# Patient Record
Sex: Female | Born: 1950 | Race: White | Hispanic: No | Smoking: Never smoker
Health system: Southern US, Community
[De-identification: ages and names within clinical notes are randomized; demographics above are authoritative.]

## PROBLEM LIST (undated history)

## (undated) DIAGNOSIS — F419 Anxiety disorder, unspecified: Secondary | ICD-10-CM

## (undated) DIAGNOSIS — N390 Urinary tract infection, site not specified: Secondary | ICD-10-CM

## (undated) DIAGNOSIS — G2581 Restless legs syndrome: Secondary | ICD-10-CM

## (undated) DIAGNOSIS — D510 Vitamin B12 deficiency anemia due to intrinsic factor deficiency: Secondary | ICD-10-CM

## (undated) DIAGNOSIS — F410 Panic disorder [episodic paroxysmal anxiety] without agoraphobia: Secondary | ICD-10-CM

## (undated) DIAGNOSIS — D473 Essential (hemorrhagic) thrombocythemia: Principal | ICD-10-CM

## (undated) DIAGNOSIS — E7800 Pure hypercholesterolemia, unspecified: Secondary | ICD-10-CM

## (undated) DIAGNOSIS — M549 Dorsalgia, unspecified: Secondary | ICD-10-CM

## (undated) HISTORY — PX: TUBAL LIGATION: SHX77

## (undated) HISTORY — DX: Vitamin B12 deficiency anemia due to intrinsic factor deficiency: D51.0

## (undated) HISTORY — PX: CHOLECYSTECTOMY: SHX55

## (undated) HISTORY — DX: Essential (hemorrhagic) thrombocythemia: D47.3

---

## 2001-12-15 ENCOUNTER — Ambulatory Visit (HOSPITAL_COMMUNITY): Admission: RE | Admit: 2001-12-15 | Discharge: 2001-12-15 | Payer: Self-pay | Admitting: Occupational Therapy

## 2001-12-15 ENCOUNTER — Encounter: Payer: Self-pay | Admitting: Occupational Therapy

## 2003-03-28 ENCOUNTER — Encounter: Payer: Self-pay | Admitting: Emergency Medicine

## 2003-03-28 ENCOUNTER — Inpatient Hospital Stay (HOSPITAL_COMMUNITY): Admission: EM | Admit: 2003-03-28 | Discharge: 2003-03-30 | Payer: Self-pay | Admitting: Emergency Medicine

## 2003-03-29 ENCOUNTER — Encounter: Payer: Self-pay | Admitting: Orthopaedic Surgery

## 2003-06-28 ENCOUNTER — Ambulatory Visit (HOSPITAL_COMMUNITY): Admission: RE | Admit: 2003-06-28 | Discharge: 2003-06-28 | Payer: Self-pay | Admitting: Occupational Therapy

## 2003-06-28 ENCOUNTER — Encounter: Payer: Self-pay | Admitting: Occupational Therapy

## 2004-07-25 ENCOUNTER — Ambulatory Visit (HOSPITAL_COMMUNITY): Admission: RE | Admit: 2004-07-25 | Discharge: 2004-07-25 | Payer: Self-pay | Admitting: Occupational Therapy

## 2004-09-02 ENCOUNTER — Ambulatory Visit (HOSPITAL_COMMUNITY): Admission: RE | Admit: 2004-09-02 | Discharge: 2004-09-02 | Payer: Self-pay | Admitting: General Surgery

## 2005-06-10 ENCOUNTER — Emergency Department (HOSPITAL_COMMUNITY): Admission: EM | Admit: 2005-06-10 | Discharge: 2005-06-11 | Payer: Self-pay | Admitting: *Deleted

## 2005-08-07 ENCOUNTER — Ambulatory Visit (HOSPITAL_COMMUNITY): Admission: RE | Admit: 2005-08-07 | Discharge: 2005-08-07 | Payer: Self-pay | Admitting: Occupational Therapy

## 2006-09-01 ENCOUNTER — Ambulatory Visit (HOSPITAL_COMMUNITY): Admission: RE | Admit: 2006-09-01 | Discharge: 2006-09-01 | Payer: Self-pay | Admitting: Nurse Practitioner

## 2007-09-05 ENCOUNTER — Ambulatory Visit (HOSPITAL_COMMUNITY): Admission: RE | Admit: 2007-09-05 | Discharge: 2007-09-05 | Payer: Self-pay | Admitting: Nurse Practitioner

## 2008-03-18 ENCOUNTER — Emergency Department (HOSPITAL_COMMUNITY): Admission: EM | Admit: 2008-03-18 | Discharge: 2008-03-18 | Payer: Self-pay | Admitting: Emergency Medicine

## 2008-09-11 ENCOUNTER — Ambulatory Visit (HOSPITAL_COMMUNITY): Admission: RE | Admit: 2008-09-11 | Discharge: 2008-09-11 | Payer: Self-pay | Admitting: Nurse Practitioner

## 2008-10-10 ENCOUNTER — Emergency Department (HOSPITAL_COMMUNITY): Admission: EM | Admit: 2008-10-10 | Discharge: 2008-10-10 | Payer: Self-pay | Admitting: Emergency Medicine

## 2009-02-15 ENCOUNTER — Ambulatory Visit: Payer: Self-pay | Admitting: Oncology

## 2009-03-19 LAB — COMPREHENSIVE METABOLIC PANEL
ALT: 23 U/L (ref 0–35)
AST: 20 U/L (ref 0–37)
Albumin: 4.2 g/dL (ref 3.5–5.2)
CO2: 28 mEq/L (ref 19–32)
Calcium: 9.4 mg/dL (ref 8.4–10.5)
Chloride: 107 mEq/L (ref 96–112)
Potassium: 3.8 mEq/L (ref 3.5–5.3)

## 2009-03-19 LAB — CBC WITH DIFFERENTIAL/PLATELET
BASO%: 0.3 % (ref 0.0–2.0)
Eosinophils Absolute: 0.1 10*3/uL (ref 0.0–0.5)
HCT: 39.6 % (ref 34.8–46.6)
MCHC: 35.8 g/dL (ref 31.5–36.0)
MONO#: 0.5 10*3/uL (ref 0.1–0.9)
NEUT#: 4.7 10*3/uL (ref 1.5–6.5)
NEUT%: 64 % (ref 38.4–76.8)
Platelets: 538 10*3/uL — ABNORMAL HIGH (ref 145–400)
WBC: 7.3 10*3/uL (ref 3.9–10.3)
lymph#: 2.1 10*3/uL (ref 0.9–3.3)

## 2009-03-19 LAB — CHCC SMEAR

## 2009-03-19 LAB — LACTATE DEHYDROGENASE: LDH: 125 U/L (ref 94–250)

## 2009-03-26 LAB — SEDIMENTATION RATE: Sed Rate: 2 mm/hr (ref 0–22)

## 2009-03-26 LAB — FERRITIN: Ferritin: 54 ng/mL (ref 10–291)

## 2009-04-24 ENCOUNTER — Ambulatory Visit: Payer: Self-pay | Admitting: Oncology

## 2009-04-26 LAB — CBC WITH DIFFERENTIAL/PLATELET
BASO%: 0.5 % (ref 0.0–2.0)
Eosinophils Absolute: 0.1 10*3/uL (ref 0.0–0.5)
LYMPH%: 37 % (ref 14.0–49.7)
MCHC: 34.3 g/dL (ref 31.5–36.0)
MONO#: 0.3 10*3/uL (ref 0.1–0.9)
NEUT#: 2.9 10*3/uL (ref 1.5–6.5)
Platelets: 500 10*3/uL — ABNORMAL HIGH (ref 145–400)
RBC: 4.26 10*6/uL (ref 3.70–5.45)
RDW: 12.4 % (ref 11.2–14.5)
WBC: 5.3 10*3/uL (ref 3.9–10.3)
lymph#: 2 10*3/uL (ref 0.9–3.3)

## 2009-06-05 ENCOUNTER — Ambulatory Visit: Payer: Self-pay | Admitting: Gastroenterology

## 2009-06-05 DIAGNOSIS — R1013 Epigastric pain: Secondary | ICD-10-CM

## 2009-06-05 DIAGNOSIS — K3189 Other diseases of stomach and duodenum: Secondary | ICD-10-CM | POA: Insufficient documentation

## 2009-06-10 ENCOUNTER — Ambulatory Visit: Payer: Self-pay | Admitting: Gastroenterology

## 2009-06-10 ENCOUNTER — Encounter: Payer: Self-pay | Admitting: Gastroenterology

## 2009-06-10 ENCOUNTER — Ambulatory Visit (HOSPITAL_COMMUNITY): Admission: RE | Admit: 2009-06-10 | Discharge: 2009-06-10 | Payer: Self-pay | Admitting: Gastroenterology

## 2009-06-11 ENCOUNTER — Encounter: Payer: Self-pay | Admitting: Gastroenterology

## 2009-07-24 ENCOUNTER — Ambulatory Visit (HOSPITAL_COMMUNITY): Admission: RE | Admit: 2009-07-24 | Discharge: 2009-07-24 | Payer: Self-pay | Admitting: Nurse Practitioner

## 2009-11-14 ENCOUNTER — Ambulatory Visit (HOSPITAL_COMMUNITY): Admission: RE | Admit: 2009-11-14 | Discharge: 2009-11-14 | Payer: Self-pay | Admitting: Nurse Practitioner

## 2011-02-18 ENCOUNTER — Emergency Department (HOSPITAL_COMMUNITY)
Admission: EM | Admit: 2011-02-18 | Discharge: 2011-02-18 | Disposition: A | Discharge status: Home / Self Care | Payer: 59 | Attending: Emergency Medicine | Admitting: Emergency Medicine

## 2011-02-18 ENCOUNTER — Emergency Department (HOSPITAL_COMMUNITY): Payer: 59

## 2011-02-18 DIAGNOSIS — G43909 Migraine, unspecified, not intractable, without status migrainosus: Secondary | ICD-10-CM | POA: Insufficient documentation

## 2011-02-18 DIAGNOSIS — R509 Fever, unspecified: Secondary | ICD-10-CM | POA: Insufficient documentation

## 2011-02-18 DIAGNOSIS — E785 Hyperlipidemia, unspecified: Secondary | ICD-10-CM | POA: Insufficient documentation

## 2011-02-24 NOTE — Op Note (Signed)
Colleen Nash, DAU NO.:  [id number]   MEDICAL RECORD NO.:  [id number]          PATIENT TYPE:  AMB   LOCATION:  DAY                           FACILITY:  [facility]   PHYSICIAN:  Kassie Mends, M.D.      DATE OF BIRTH:  17-Dec-1950   DATE OF PROCEDURE:  06/10/2009  DATE OF DISCHARGE:                                PROCEDURE NOTE   REFERRING Shareef Eddinger:  Ninfa Linden, NP-c   PROCEDURE:  Esophagogastroduodenoscopy with cold forceps biopsy of the  gastric and esophageal mucosa.   INDICATION FOR EXAM:  Colleen Nash is a 60 year old female who presents  with abdominal pain and nausea since May 2009.  She uses aspirin for  headaches.  She takes Prilosec over-the-counter for gastroesophageal  reflux disease.   FINDINGS:  1. Two columnar-appearing patches seen in the proximal esophagus and      just below the upper esophageal sphincter.  Biopsies obtained via      cold forceps.  Otherwise, no changes of Barrett's seen in the      distal esophagus, mass, stricture, or erosion.  2. Diffuse erythema in the body and the antrum with occasional      erosions and 2 small ulcerations.  Biopsies obtained via cold      forceps to evaluate for H. pylori gastritis.  3. Normal duodenal bulb, ampulla, and second portion of the duodenum.   DIAGNOSES:  1. Peptic ulcer disease with underlying gastritis likely cause of her      abdominal pain and nausea.  2. Columnar-appearing epithelium in the proximal esophagus.   RECOMMENDATIONS:  1. Increase omeprazole 20 mg twice daily for 30 days and once daily      indefinitely.  2. Will call her with results of her biopsies.  3. She is given a handout on gastritis.  4. No aspirin or NSAIDs for 30 days.  No anticoagulation for 7 days.  5. She already has a follow up appointment to see me in 2 months.   MEDICATIONS:  1. Demerol 125 mg  IV.  2. Versed 5 mg IV.   PROCEDURE TECHNIQUE:  Physical exam was performed.  Informed consent was  obtained  from the patient after explaining the benefits, risks, and  alternatives to the procedure.  The patient was connected to the monitor  and placed in the left lateral position.  Continuous oxygen was provided  by nasal cannula and IV medicine administered through an indwelling  cannula.  After administration of sedation, the patient's esophagus was  intubated and the scope was advanced under direct visualization to the  second portion of the duodenum. The scope was removed  slowly by carefully examining the color, texture, anatomy, and integrity  of the mucosa on the way out.  The patient was recovered in endoscopy  and discharged home in satisfactory condition.   PATH:  CHRONIC GASTRITIS. GERD. BID PPI.      Kassie Mends, M.D.  Electronically Signed     SM/MEDQ  D:  06/10/2009  T:  06/10/2009  Job:  [id number]   cc:   Ninfa Linden,  NP

## 2011-02-27 NOTE — H&P (Signed)
NAME:  Colleen Nash, Colleen Nash NO.:  [id number]   MEDICAL RECORD NO.:  [id number]           PATIENT TYPE:   LOCATION:                                 FACILITY:   PHYSICIAN:  Dalia Heading, M.D.  DATE OF BIRTH:  [birth year]   DATE OF ADMISSION:  DATE OF DISCHARGE:  LH                                HISTORY & PHYSICAL   CHIEF COMPLAINT:  Need for screening colonoscopy.   HISTORY OF PRESENT ILLNESS:  The patient is a 60 year old white female who  is referred for a screening colonoscopy.  She has never had a colonoscopy.  There is no history of abdominal pain, weight loss, nausea, vomiting,  diarrhea, constipation, melena or hematochezia.  There is no family history  of colon carcinoma.   PAST MEDICAL HISTORY:  Anxiety.   PAST SURGICAL HISTORY:  1.  Laparoscopic cholecystectomy.  2.  Multiple C-sections.   CURRENT MEDICATIONS:  Zyrtec, Avicor and Klonopin.   ALLERGIES:  MORPHINE.   REVIEW OF SYSTEMS:  Noncontributory.   PHYSICAL EXAMINATION:  GENERAL APPEARANCE:  The patient is a well-developed,  well-nourished, white female in no acute distress.  VITAL SIGNS:  She is afebrile and vital signs are stable.  LUNGS:  Clear to auscultation with equal breath sounds bilaterally.  HEART:  Examination reveals a regular rate and rhythm without S3, S4 or  murmurs.  ABDOMEN:  Soft, nontender and nondistended.  No hepatosplenomegaly or masses  are noted.  RECTAL:  Examination was deferred to the procedure.   IMPRESSION:  Need for screening colonoscopy.   PLAN:  The patient is scheduled for a colonoscopy on September 02, 2004.  The  risks and benefits of the procedure, including bleeding perforation were  fully explained to the patient, who gave informed consent.     Mark   MAJ/MEDQ  D:  08/14/2004  T:  08/14/2004  Job:  [id number]   cc:   Randell Patient  [address]  [place]  [place] [address]  Fax: [phone]

## 2011-02-27 NOTE — H&P (Signed)
NAME:  Colleen Nash, Colleen Nash                         ACCOUNT NO.:  [id number]   MEDICAL RECORD NO.:  [id number]                   PATIENT TYPE:  EMS   LOCATION:  ED                                   FACILITY:  [facility]   PHYSICIAN:  Hilario Quarry, M.D.               DATE OF BIRTH:  [birth year]   DATE OF ADMISSION:  DATE OF DISCHARGE:                                HISTORY & PHYSICAL   CHIEF COMPLAINT:  Injury, left ankle.   HISTORY OF PRESENT ILLNESS:  The patient is a 60 year old white female who  fell in her home earlier today while vacuuming.  She stated somehow or  another she got tangled up on the vacuum cleaner cord, fell and injured her  left ankle.  The only other injury she had is a strain or contusion to the  right foot.  She was brought to the emergency room at the hospital because  of marked pain, tenderness, swelling to the left ankle.  X-rays of the ankle  showed essentially nondisplaced bimalleolar fracture with marked soft tissue  swelling.  Medially there appeared to be __________  type fracture.  Actually I could not call this a fracture.   In regards to her right foot, there is some tenderness to the dorsum of her  foot but there is really minimal swelling and no bruising.  She had full  range of motion, neurovascularly intact.   PAST MEDICAL HISTORY:  The patient states she is in excellent health.  She  denies diabetes mellitus, hypertension, stroke, cardiac, or respiratory  problems.   PAST SURGICAL HISTORY:  1. Cesarean section x2.  2. Bilateral tubal ligation.  3. Cholecystectomy.   MEDICATIONS:  None.   ALLERGIES:  None.   LOCAL MEDICAL DOCTOR:  She is followed by Ninfa Linden, N.P. in  [place], [place].   FAMILY AND SOCIAL HISTORY:  She is married.  She has two children.  She does  not smoke or use alcoholic beverages.   REVIEW OF SYSTEMS:  Essentially negative for this problem.   PHYSICAL EXAMINATION:  GENERAL:  Alert and oriented x3.  HEENT:  Normal.  NECK:  Supple.  No thyromegaly or masses palpated.  LUNGS:  Clear to auscultation and percussion.  HEART:  Regular rhythm without murmur.  No cardiomegaly.  ABDOMEN:  Protuberant, obese, soft, and nontender.  No organomegaly or  masses palpated.  Hyperactive bowel sounds auscultated.  EXTREMITIES:  The left ankle is markedly swollen.  There is bruising that  extends down into the dorsal part of the foot both medially and laterally.  Range of motion and palpation of the ankle is very painful.  Range of motion  was limited secondary to pain and swelling.  Neurovascular is intact.  Examination of the right ankle shows slight tenderness to the right ankle.  There was slight tenderness to the dorsum of the foot, minimal swelling,  full range  of motion, neurovascularly intact. Other extremities normal,  neurovascularly intact exam.  SKIN:  Intact.   IMPRESSION:  1. Bimalleolar fracture of the left ankle.  2. Strained right ankle and foot.   PLAN:  The patient is to be admitted for pain control and physical therapy.  Labs pending.      Candace Cruise, P.A.                        Hilario Quarry, M.D.    BB/MEDQ  D:  03/28/2003  T:  03/28/2003  Job:  [id number]

## 2011-02-27 NOTE — Discharge Summary (Signed)
   NAMEBRIUNNA, LEICHT                         ACCOUNT NO.:  [id number]   MEDICAL RECORD NO.:  [id number]                   PATIENT TYPE:  INP   LOCATION:  A339                                 FACILITY:  [facility]   PHYSICIAN:  J. Darreld Mclean, M.D.              DATE OF BIRTH:  1951-10-12   DATE OF ADMISSION:  03/28/2003  DATE OF DISCHARGE:  03/30/2003                                 DISCHARGE SUMMARY   DISCHARGE DIAGNOSIS:  Trimalleolar fracture of the ankle on the left and a  strain ankle on the right.   HOSPITAL COURSE:  The patient was seen and evaluated with the above-  mentioned injury.  It was a closed injury - the trimalleolar fracture.  It  was essentially nondisplaced.  She had a strain on the right.  She was put  in a cast on the left and an air cast on the right.  I had physical therapy  see her and because of both lower extremities involved she had difficulty  moving about.  She was seen in therapy.  She continued to improve.  She was  able to transfer and get around and then she was able to be discharged.  Lab  values were within normal limits.  She was told particularly no  weightbearing, elevate the foot.  If she had any difficulties or problem let  me know, that she could possibly need surgery.  See her in the office on  April 06, 2003.   DISCHARGE MEDICATIONS:  Tylox.                                               Teola Bradley, M.D.    JWK/MEDQ  D:  04/09/2003  T:  04/09/2003  Job:  [id number]

## 2011-07-16 LAB — URINALYSIS, ROUTINE W REFLEX MICROSCOPIC
Nitrite: NEGATIVE
Specific Gravity, Urine: 1.01 (ref 1.005–1.030)
Urobilinogen, UA: 0.2 mg/dL (ref 0.0–1.0)
pH: 6.5 (ref 5.0–8.0)

## 2011-07-16 LAB — CBC
Hemoglobin: 13.5 g/dL (ref 12.0–15.0)
RBC: 4.34 MIL/uL (ref 3.87–5.11)
RDW: 12.5 % (ref 11.5–15.5)

## 2011-07-16 LAB — DIFFERENTIAL
Basophils Absolute: 0 10*3/uL (ref 0.0–0.1)
Lymphocytes Relative: 20 % (ref 12–46)
Monocytes Absolute: 0.5 10*3/uL (ref 0.1–1.0)
Neutro Abs: 7.3 10*3/uL (ref 1.7–7.7)

## 2011-07-16 LAB — COMPREHENSIVE METABOLIC PANEL
AST: 17 U/L (ref 0–37)
Albumin: 4 g/dL (ref 3.5–5.2)
BUN: 10 mg/dL (ref 6–23)
Calcium: 9.5 mg/dL (ref 8.4–10.5)
Chloride: 109 mEq/L (ref 96–112)
Creatinine, Ser: 0.81 mg/dL (ref 0.4–1.2)
GFR calc Af Amer: >60 mL/min (ref >60)
Sodium: 141 mEq/L (ref 135–145)
Total Bilirubin: 0.6 mg/dL (ref 0.3–1.2)
Total Protein: 6.6 g/dL (ref 6.0–8.3)

## 2011-08-06 ENCOUNTER — Other Ambulatory Visit (HOSPITAL_COMMUNITY): Payer: Self-pay | Admitting: Nurse Practitioner

## 2011-08-06 DIAGNOSIS — Z139 Encounter for screening, unspecified: Secondary | ICD-10-CM

## 2011-08-18 ENCOUNTER — Ambulatory Visit (HOSPITAL_COMMUNITY)
Admission: RE | Admit: 2011-08-18 | Discharge: 2011-08-18 | Disposition: A | Discharge status: Home / Self Care | Payer: 59 | Source: Ambulatory Visit | Attending: Nurse Practitioner | Admitting: Nurse Practitioner

## 2011-08-18 DIAGNOSIS — Z1231 Encounter for screening mammogram for malignant neoplasm of breast: Secondary | ICD-10-CM | POA: Insufficient documentation

## 2011-08-18 DIAGNOSIS — Z139 Encounter for screening, unspecified: Secondary | ICD-10-CM

## 2012-02-14 ENCOUNTER — Emergency Department (HOSPITAL_COMMUNITY)
Admission: EM | Admit: 2012-02-14 | Discharge: 2012-02-14 | Disposition: A | Discharge status: Home / Self Care | Payer: 59 | Attending: Emergency Medicine | Admitting: Emergency Medicine

## 2012-02-14 ENCOUNTER — Encounter (HOSPITAL_COMMUNITY): Payer: Self-pay

## 2012-02-14 DIAGNOSIS — N390 Urinary tract infection, site not specified: Secondary | ICD-10-CM

## 2012-02-14 DIAGNOSIS — E7800 Pure hypercholesterolemia, unspecified: Secondary | ICD-10-CM | POA: Insufficient documentation

## 2012-02-14 DIAGNOSIS — F411 Generalized anxiety disorder: Secondary | ICD-10-CM | POA: Insufficient documentation

## 2012-02-14 DIAGNOSIS — R110 Nausea: Secondary | ICD-10-CM | POA: Insufficient documentation

## 2012-02-14 DIAGNOSIS — M545 Low back pain, unspecified: Secondary | ICD-10-CM | POA: Insufficient documentation

## 2012-02-14 HISTORY — DX: Pure hypercholesterolemia, unspecified: E78.00

## 2012-02-14 HISTORY — DX: Restless legs syndrome: G25.81

## 2012-02-14 HISTORY — DX: Anxiety disorder, unspecified: F41.9

## 2012-02-14 HISTORY — DX: Urinary tract infection, site not specified: N39.0

## 2012-02-14 HISTORY — DX: Panic disorder (episodic paroxysmal anxiety): F41.0

## 2012-02-14 HISTORY — DX: Dorsalgia, unspecified: M54.9

## 2012-02-14 LAB — URINE MICROSCOPIC-ADD ON

## 2012-02-14 LAB — URINALYSIS, ROUTINE W REFLEX MICROSCOPIC
Hgb urine dipstick: NEGATIVE
Ketones, ur: NEGATIVE mg/dL
Protein, ur: NEGATIVE mg/dL
Specific Gravity, Urine: 1.01 (ref 1.005–1.030)

## 2012-02-14 MED ORDER — CIPROFLOXACIN HCL 250 MG PO TABS
500.0000 mg | ORAL_TABLET | Freq: Once | ORAL | Status: AC
  Administered 2012-02-14: 500 mg via ORAL
  Filled 2012-02-14: qty 2

## 2012-02-14 MED ORDER — ONDANSETRON 4 MG PO TBDP: 4.0000 mg | ORAL_TABLET | Freq: Three times a day (TID) | ORAL | Status: DC | PRN

## 2012-02-14 MED ORDER — ONDANSETRON 4 MG PO TBDP
4.0000 mg | ORAL_TABLET | Freq: Once | ORAL | Status: AC
  Administered 2012-02-14: 4 mg via ORAL
  Filled 2012-02-14: qty 1

## 2012-02-14 MED ORDER — CIPROFLOXACIN HCL 500 MG PO TABS: 500.0000 mg | ORAL_TABLET | Freq: Two times a day (BID) | ORAL | Status: AC

## 2012-02-14 NOTE — ED Provider Notes (Signed)
History     CSN: [id number]  Arrival date & time 02/14/12  1191   First MD Initiated Contact with Patient 02/14/12 [phone]      Chief Complaint  Patient presents with  . Urinary Tract Infection    (Consider location/radiation/quality/duration/timing/severity/associated sxs/prior treatment) HPI Comments: States she has had lower back pain for ~ 2 months.  She also notes that she is seeing an MD for a "pinched nerve" in her back.  She has dysuria, frequency and urgency.  No visible hematuria.  Patient is a 61 y.o. female presenting with urinary tract infection. The history is provided by the patient. No language interpreter was used.  Urinary Tract Infection This is a new problem. Episode onset: 2 days ago. The problem has been gradually worsening. Associated symptoms include chills, a fever, nausea and urinary symptoms. Pertinent negatives include no abdominal pain or vomiting.    Past Medical History  Diagnosis Date  . Back pain   . Urinary tract infection   . Hypercholesteremia   . Anxiety   . Panic attacks   . Restless leg     Past Surgical History  Procedure Date  . Cholecystectomy   . Tubal ligation     No family history on file.  History  Substance Use Topics  . Smoking status: Never Smoker   . Smokeless tobacco: Not on file  . Alcohol Use: Yes     occ    OB History    Grav Para Term Preterm Abortions TAB SAB Ect Mult Living                  Review of Systems  Constitutional: Positive for fever and chills.  Gastrointestinal: Positive for nausea. Negative for vomiting and abdominal pain.  Genitourinary: Positive for dysuria, urgency and frequency. Negative for hematuria, flank pain, vaginal bleeding, vaginal discharge and pelvic pain.  All other systems reviewed and are negative.    Allergies  Morphine  Home Medications   Current Outpatient Rx  Name Route Sig Dispense Refill  . ASPIRIN-ACETAMINOPHEN-CAFFEINE 250-250-65 MG PO TABS Oral Take 1 tablet  by mouth every 6 (six) hours as needed. migraines    . ATORVASTATIN CALCIUM 20 MG PO TABS Oral Take 20 mg by mouth at bedtime.    . CHOLECALCIFEROL 5000 UNITS PO CAPS Oral Take 5,000 Units by mouth at bedtime.    Marland Kitchen CLONAZEPAM 1 MG PO TABS Oral Take 1 mg by mouth at bedtime.     . OMEGA-3-ACID ETHYL ESTERS 1 G PO CAPS Oral Take 1 g by mouth 2 (two) times daily.    Marland Kitchen PAROXETINE HCL 10 MG PO TABS Oral Take 10 mg by mouth at bedtime.     Marland Kitchen CIPROFLOXACIN HCL 500 MG PO TABS Oral Take 1 tablet (500 mg total) by mouth 2 (two) times daily. 10 tablet 0  . ONDANSETRON 4 MG PO TBDP Oral Take 1 tablet (4 mg total) by mouth every 8 (eight) hours as needed for nausea. 10 tablet 0    BP 119/84  Pulse 76  Temp(Src) 97.5 F (36.4 C) (Oral)  Resp 20  Ht 5\' 6"  (1.676 m)  Wt 140 lb (63.504 kg)  BMI 22.60 kg/m2  SpO2 100%  Physical Exam  Nursing note and vitals reviewed. Constitutional: She is oriented to person, place, and time. She appears well-developed and well-nourished. No distress.  HENT:  Head: Normocephalic and atraumatic.  Eyes: EOM are normal.  Neck: Normal range of motion.  Cardiovascular: Normal rate,  regular rhythm and normal heart sounds.   Pulmonary/Chest: Effort normal and breath sounds normal.  Abdominal: Soft. She exhibits no distension and no mass. There is no tenderness. There is no guarding.  Musculoskeletal: Normal range of motion.       Arms: Neurological: She is alert and oriented to person, place, and time.  Skin: Skin is warm and dry.  Psychiatric: She has a normal mood and affect. Judgment normal.    ED Course  Procedures (including critical care time)  Labs Reviewed  URINALYSIS, ROUTINE W REFLEX MICROSCOPIC - Abnormal; Notable for the following:    APPearance HAZY (*)    Leukocytes, UA SMALL (*)    All other components within normal limits  URINE MICROSCOPIC-ADD ON - Abnormal; Notable for the following:    Bacteria, UA MANY (*)    Casts WBC CAST (*)    All other  components within normal limits  URINE CULTURE   No results found.   1. UTI (urinary tract infection)       MDM  urine culture pending. rx- cipro 500 mg , BID, 10 Increase fluid intake.        Worthy Rancher, PA 02/14/12 [phone]

## 2012-02-14 NOTE — ED Notes (Signed)
Pt reports uti s/s for over 1 week, low back pain and fever during the night.

## 2012-02-14 NOTE — Discharge Instructions (Signed)
Urinary Tract Infection Infections of the urinary tract can start in several places. A bladder infection (cystitis), a kidney infection (pyelonephritis), and a prostate infection (prostatitis) are different types of urinary tract infections (UTIs). They usually get better if treated with medicines (antibiotics) that kill germs. Take all the medicine until it is gone. You or your child may feel better in a few days, but TAKE ALL MEDICINE or the infection may not respond and may become more difficult to treat. HOME CARE INSTRUCTIONS   Drink enough water and fluids to keep the urine clear or pale yellow. Cranberry juice is especially recommended, in addition to large amounts of water.   Avoid caffeine, tea, and carbonated beverages. They tend to irritate the bladder.   Alcohol may irritate the prostate.   Only take over-the-counter or prescription medicines for pain, discomfort, or fever as directed by your caregiver.  To prevent further infections:  Empty the bladder often. Avoid holding urine for long periods of time.   After a bowel movement, women should cleanse from front to back. Use each tissue only once.   Empty the bladder before and after sexual intercourse.  FINDING OUT THE RESULTS OF YOUR TEST Not all test results are available during your visit. If your or your child's test results are not back during the visit, make an appointment with your caregiver to find out the results. Do not assume everything is normal if you have not heard from your caregiver or the medical facility. It is important for you to follow up on all test results. SEEK MEDICAL CARE IF:   There is back pain.   Your baby is older than 3 months with a rectal temperature of 100.5 F (38.1 C) or higher for more than 1 day.   Your or your child's problems (symptoms) are no better in 3 days. Return sooner if you or your child is getting worse.  SEEK IMMEDIATE MEDICAL CARE IF:   There is severe back pain or lower  abdominal pain.   You or your child develops chills.   You have a fever.   Your baby is older than 3 months with a rectal temperature of 102 F (38.9 C) or higher.   Your baby is 68 months old or younger with a rectal temperature of 100.4 F (38 C) or higher.   There is nausea or vomiting.   There is continued burning or discomfort with urination.  MAKE SURE YOU:   Understand these instructions.   Will watch your condition.   Will get help right away if you are not doing well or get worse.  Document Released: 07/08/2005 Document Revised: 09/17/2011 Document Reviewed: 02/10/2007 [facility] Patient Information 2012 [place], [place].   Take the meds as directed.  Increase your fluid intake.  Follow up with dr. Margo Aye.

## 2012-02-15 NOTE — ED Provider Notes (Signed)
Medical screening examination/treatment/procedure(s) were performed by non-physician practitioner and as supervising physician I was immediately available for consultation/collaboration.  Donnetta Hutching, MD 02/15/12 2159

## 2012-02-16 LAB — URINE CULTURE
Colony Count: >=100000
Culture  Setup Time: 201305052106

## 2012-02-17 NOTE — ED Notes (Signed)
+   Urine Patient treated with Cipro-senitive to same-chart appended per protocol MD.

## 2012-02-22 ENCOUNTER — Emergency Department (HOSPITAL_COMMUNITY)
Admission: EM | Admit: 2012-02-22 | Discharge: 2012-02-22 | Disposition: A | Discharge status: Home / Self Care | Payer: 59 | Attending: Emergency Medicine | Admitting: Emergency Medicine

## 2012-02-22 ENCOUNTER — Encounter (HOSPITAL_COMMUNITY): Payer: Self-pay | Admitting: *Deleted

## 2012-02-22 ENCOUNTER — Emergency Department (HOSPITAL_COMMUNITY): Payer: 59

## 2012-02-22 DIAGNOSIS — M5137 Other intervertebral disc degeneration, lumbosacral region: Secondary | ICD-10-CM | POA: Insufficient documentation

## 2012-02-22 DIAGNOSIS — M129 Arthropathy, unspecified: Secondary | ICD-10-CM | POA: Insufficient documentation

## 2012-02-22 DIAGNOSIS — M549 Dorsalgia, unspecified: Secondary | ICD-10-CM

## 2012-02-22 DIAGNOSIS — IMO0002 Reserved for concepts with insufficient information to code with codable children: Secondary | ICD-10-CM

## 2012-02-22 DIAGNOSIS — M545 Low back pain, unspecified: Secondary | ICD-10-CM | POA: Insufficient documentation

## 2012-02-22 DIAGNOSIS — M51379 Other intervertebral disc degeneration, lumbosacral region without mention of lumbar back pain or lower extremity pain: Secondary | ICD-10-CM | POA: Insufficient documentation

## 2012-02-22 MED ORDER — FENTANYL CITRATE 0.05 MG/ML IJ SOLN
50.0000 ug | Freq: Once | INTRAMUSCULAR | Status: AC
  Administered 2012-02-22: 50 ug via INTRAMUSCULAR
  Filled 2012-02-22: qty 2

## 2012-02-22 MED ORDER — DEXAMETHASONE SODIUM PHOSPHATE 4 MG/ML IJ SOLN
8.0000 mg | Freq: Once | INTRAMUSCULAR | Status: AC
  Administered 2012-02-22: 8 mg via INTRAMUSCULAR
  Filled 2012-02-22: qty 2

## 2012-02-22 MED ORDER — DIAZEPAM 5 MG PO TABS
5.0000 mg | ORAL_TABLET | Freq: Once | ORAL | Status: AC
  Administered 2012-02-22: 5 mg via ORAL
  Filled 2012-02-22: qty 1

## 2012-02-22 MED ORDER — ONDANSETRON HCL 4 MG PO TABS
4.0000 mg | ORAL_TABLET | Freq: Once | ORAL | Status: AC
  Administered 2012-02-22: 4 mg via ORAL
  Filled 2012-02-22: qty 1

## 2012-02-22 MED ORDER — DIPHENHYDRAMINE HCL 12.5 MG/5ML PO ELIX
12.5000 mg | ORAL_SOLUTION | Freq: Once | ORAL | Status: AC
  Administered 2012-02-22: 12.5 mg via ORAL
  Filled 2012-02-22: qty 5

## 2012-02-22 MED ORDER — METHOCARBAMOL 500 MG PO TABS: ORAL_TABLET | ORAL | Status: DC

## 2012-02-22 MED ORDER — DEXAMETHASONE 4 MG PO TABS: ORAL_TABLET | ORAL | Status: AC

## 2012-02-22 MED ORDER — HYDROCODONE-ACETAMINOPHEN 5-325 MG PO TABS: ORAL_TABLET | ORAL | Status: DC

## 2012-02-22 NOTE — ED Provider Notes (Signed)
History     CSN: [id number]  Arrival date & time 02/22/12  1213   First MD Initiated Contact with Patient 02/22/12 1355      Chief Complaint  Patient presents with  . Back Pain    (Consider location/radiation/quality/duration/timing/severity/associated sxs/prior treatment) Patient is a 61 y.o. female presenting with back pain. The history is provided by the patient.  Back Pain  The current episode started more than 1 week ago. The problem occurs daily. The problem has been gradually worsening. The pain is associated with twisting. The pain is severe. The symptoms are aggravated by certain positions. The pain is the same all the time. Stiffness is present all day. Pertinent negatives include no chest pain, no numbness, no abdominal pain, no bowel incontinence, no perianal numbness, no bladder incontinence and no dysuria. Associated symptoms comments: constipation. She has tried NSAIDs for the symptoms. The treatment provided no relief.    Past Medical History  Diagnosis Date  . Back pain   . Urinary tract infection   . Hypercholesteremia   . Anxiety   . Panic attacks   . Restless leg     Past Surgical History  Procedure Date  . Cholecystectomy   . Tubal ligation   . Cesarean section     Family History  Problem Relation Age of Onset  . Heart failure Mother   . Cancer Father     History  Substance Use Topics  . Smoking status: Never Smoker   . Smokeless tobacco: Not on file  . Alcohol Use: Yes     occ    OB History    Grav Para Term Preterm Abortions TAB SAB Ect Mult Living                  Review of Systems  Constitutional: Negative for activity change.       All ROS Neg except as noted in HPI  HENT: Negative for nosebleeds and neck pain.   Eyes: Negative for photophobia and discharge.  Respiratory: Negative for cough, shortness of breath and wheezing.   Cardiovascular: Negative for chest pain and palpitations.  Gastrointestinal: Negative for abdominal  pain, blood in stool and bowel incontinence.  Genitourinary: Negative for bladder incontinence, dysuria, frequency and hematuria.  Musculoskeletal: Positive for back pain. Negative for arthralgias.  Skin: Negative.   Neurological: Negative for dizziness, seizures, speech difficulty and numbness.  Psychiatric/Behavioral: Negative for hallucinations and confusion.    Allergies  Morphine  Home Medications   Current Outpatient Rx  Name Route Sig Dispense Refill  . ASPIRIN-ACETAMINOPHEN-CAFFEINE 250-250-65 MG PO TABS Oral Take 1 tablet by mouth every 6 (six) hours as needed. migraines    . ATORVASTATIN CALCIUM 20 MG PO TABS Oral Take 20 mg by mouth at bedtime.    . CHOLECALCIFEROL 5000 UNITS PO CAPS Oral Take 5,000 Units by mouth at bedtime.    Marland Kitchen CLONAZEPAM 1 MG PO TABS Oral Take 1 mg by mouth at bedtime.     Marland Kitchen FLUTICASONE PROPIONATE 50 MCG/ACT NA SUSP Nasal Place 2 sprays into the nose daily as needed. Allergies    . OMEGA-3-ACID ETHYL ESTERS 1 G PO CAPS Oral Take 1 g by mouth 2 (two) times daily.    Marland Kitchen PAROXETINE HCL 10 MG PO TABS Oral Take 10 mg by mouth at bedtime.     Marland Kitchen CIPROFLOXACIN HCL 500 MG PO TABS Oral Take 1 tablet (500 mg total) by mouth 2 (two) times daily. 10 tablet 0  BP 127/72  Pulse 69  Temp(Src) 97.8 F (36.6 C) (Oral)  Resp 18  Ht 5\' 6"  (1.676 m)  Wt 140 lb (63.504 kg)  BMI 22.60 kg/m2  SpO2 99%  Physical Exam  Nursing note and vitals reviewed. Constitutional: She is oriented to person, place, and time. She appears well-developed and well-nourished.  Non-toxic appearance.  HENT:  Head: Normocephalic.  Right Ear: Tympanic membrane and external ear normal.  Left Ear: Tympanic membrane and external ear normal.  Eyes: EOM and lids are normal. Pupils are equal, round, and reactive to light.  Neck: Normal range of motion. Neck supple. Carotid bruit is not present.  Cardiovascular: Normal rate, regular rhythm, normal heart sounds, intact distal pulses and normal  pulses.   Pulmonary/Chest: Breath sounds normal. No respiratory distress.  Abdominal: Soft. Bowel sounds are normal. There is no tenderness. There is no guarding.  Musculoskeletal: Normal range of motion.       Pain to palpation from the upper lumbar area to the upper [place] area.  Pain in lower back with movement of the lower extremities  Lymphadenopathy:       Head (right side): No submandibular adenopathy present.       Head (left side): No submandibular adenopathy present.    She has no cervical adenopathy.  Neurological: She is alert and oriented to person, place, and time. She has normal strength. No cranial nerve deficit or sensory deficit.  Skin: Skin is warm and dry.  Psychiatric: She has a normal mood and affect. Her speech is normal.    ED Course  Procedures (including critical care time)  Labs Reviewed - No data to display No results found.   No diagnosis found.    MDM  **I have reviewed nursing notes, vital signs, and all appropriate lab and imaging results for this patient.* Pain improving after IM pain meds. 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 Rx for dexamethasone 4mg , robaxin 500mg , and norco 5 mg given to pt.      Kathie Dike, [place] 02/22/12 2030

## 2012-02-22 NOTE — ED Notes (Signed)
Pt with lower back pain for months per pt., pt unable to state to what may have caused this, asked if she had been doing any pulling or stenous activity and pt just nodded and stated "may have been", pt states that pain radiates down into left leg to foot, pt requesting something for pain

## 2012-02-22 NOTE — Discharge Instructions (Signed)
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 Please see Dr Dwana Melena tomorrow May 14 at 2:30pm for follow up and possible MRI . Use Robaxin and decadron daily for spasm and inflammation. Use Norco if needed for pain.This medication may cause drowsiness and constipation. Use with caution. Please apply heating pad when sitting.Back Pain, Adult Low back pain is very common. About 1 in 5 people have back pain.The cause of low back pain is rarely dangerous. The pain often gets better over time.About half of people with a sudden onset of back pain feel better in just 2 weeks. About 8 in 10 people feel better by 6 weeks.  CAUSES Some common causes of back pain include:  Strain of the muscles or ligaments supporting the spine.   Wear and tear (degeneration) of the spinal discs.   Arthritis.   Direct injury to the back.  DIAGNOSIS Most of the time, the direct cause of low back pain is not known.However, back pain can be treated effectively even when the exact cause of the pain is unknown.Answering your caregiver's questions about your overall health and symptoms is one of the most accurate ways to make sure the cause of your pain is not dangerous. If your caregiver needs more information, he or she may order lab work or imaging tests (X-rays or MRIs).However, even if imaging tests show changes in your back, this usually does not require surgery. HOME CARE INSTRUCTIONS For many people, back pain returns.Since low back pain is rarely dangerous, it is often a condition that people can learn to [facility] their own.   Remain active. It is stressful on the back to sit or stand in one place. Do not sit, drive, or stand in one place for more than 30 minutes at a time. Take short walks on level surfaces as soon as pain allows.Try to increase the length of time you walk each day.   Do not stay in bed.Resting more than 1 or 2 days can delay your  recovery.   Do not avoid exercise or work.Your body is made to move.It is not dangerous to be active, even though your back may hurt.Your back will likely heal faster if you return to being active before your pain is gone.   Pay attention to your body when you bend and lift. Many people have less discomfortwhen lifting if they bend their knees, keep the load close to their bodies,and avoid twisting. Often, the most comfortable positions are those that put less stress on your recovering back.   Find a comfortable position to sleep. Use a firm mattress and lie on your side with your knees slightly bent. If you lie on your back, put a pillow under your knees.   Only take over-the-counter or prescription medicines as directed by your caregiver. Over-the-counter medicines to reduce pain and inflammation are often the most helpful.Your caregiver may prescribe muscle relaxant drugs.These medicines help dull your pain so you can more quickly return to your normal activities and healthy exercise.   Put ice on the injured area.   Put ice in a plastic bag.   Place a towel between your skin and the bag.   Leave the ice on for 15 to 20 minutes, 3 to 4 times a day for the first 2 to 3 days. After that, ice and heat may be alternated to reduce pain and spasms.   Ask your caregiver about trying back exercises and gentle  massage. This may be of some benefit.   Avoid feeling anxious or stressed.Stress increases muscle tension and can worsen back pain.It is important to recognize when you are anxious or stressed and learn ways to manage it.Exercise is a great option.  SEEK MEDICAL CARE IF:  You have pain that is not relieved with rest or medicine.   You have pain that does not improve in 1 week.   You have new symptoms.   You are generally not feeling well.  SEEK IMMEDIATE MEDICAL CARE IF:   You have pain that radiates from your back into your legs.   You develop new bowel or bladder  control problems.   You have unusual weakness or numbness in your arms or legs.   You develop nausea or vomiting.   You develop abdominal pain.   You feel faint.  Document Released: 09/28/2005 Document Revised: 09/17/2011 Document Reviewed: 02/16/2011 [facility] Patient Information 2012 [place], [place].

## 2012-02-22 NOTE — ED Notes (Signed)
Called xray for CD of xray done today for pt to take to her MD at [facility]

## 2012-02-22 NOTE — ED Notes (Signed)
Pain lt hip, Has been taking muscle relaxers,  No recent injury.  Had uti last week and feels that is better.

## 2012-02-23 NOTE — ED Provider Notes (Signed)
Medical screening examination/treatment/procedure(s) were performed by non-physician practitioner and as supervising physician I was immediately available for consultation/collaboration.   Laray Anger, DO 02/23/12 [phone]

## 2012-05-31 ENCOUNTER — Other Ambulatory Visit (HOSPITAL_COMMUNITY): Payer: Self-pay | Admitting: Internal Medicine

## 2012-05-31 DIAGNOSIS — M549 Dorsalgia, unspecified: Secondary | ICD-10-CM

## 2012-06-03 ENCOUNTER — Other Ambulatory Visit (HOSPITAL_COMMUNITY): Payer: 59

## 2012-06-06 ENCOUNTER — Ambulatory Visit (HOSPITAL_COMMUNITY)
Admission: RE | Admit: 2012-06-06 | Discharge: 2012-06-06 | Disposition: A | Discharge status: Home / Self Care | Payer: 59 | Source: Ambulatory Visit | Attending: Internal Medicine | Admitting: Internal Medicine

## 2012-06-06 DIAGNOSIS — M545 Low back pain, unspecified: Secondary | ICD-10-CM | POA: Insufficient documentation

## 2012-06-06 DIAGNOSIS — M79609 Pain in unspecified limb: Secondary | ICD-10-CM | POA: Insufficient documentation

## 2012-06-06 DIAGNOSIS — M5126 Other intervertebral disc displacement, lumbar region: Secondary | ICD-10-CM | POA: Insufficient documentation

## 2012-06-06 DIAGNOSIS — M549 Dorsalgia, unspecified: Secondary | ICD-10-CM

## 2012-07-15 ENCOUNTER — Encounter (HOSPITAL_COMMUNITY): Payer: Self-pay | Admitting: Emergency Medicine

## 2012-07-15 ENCOUNTER — Emergency Department (HOSPITAL_COMMUNITY)
Admission: EM | Admit: 2012-07-15 | Discharge: 2012-07-15 | Disposition: A | Discharge status: Home / Self Care | Payer: 59 | Attending: Emergency Medicine | Admitting: Emergency Medicine

## 2012-07-15 ENCOUNTER — Emergency Department (HOSPITAL_COMMUNITY): Payer: 59

## 2012-07-15 DIAGNOSIS — R51 Headache: Secondary | ICD-10-CM

## 2012-07-15 DIAGNOSIS — F411 Generalized anxiety disorder: Secondary | ICD-10-CM | POA: Insufficient documentation

## 2012-07-15 DIAGNOSIS — R079 Chest pain, unspecified: Secondary | ICD-10-CM | POA: Insufficient documentation

## 2012-07-15 DIAGNOSIS — I10 Essential (primary) hypertension: Secondary | ICD-10-CM

## 2012-07-15 DIAGNOSIS — Z79899 Other long term (current) drug therapy: Secondary | ICD-10-CM | POA: Insufficient documentation

## 2012-07-15 DIAGNOSIS — E7800 Pure hypercholesterolemia, unspecified: Secondary | ICD-10-CM | POA: Insufficient documentation

## 2012-07-15 LAB — COMPREHENSIVE METABOLIC PANEL
AST: 25 U/L (ref 0–37)
CO2: 22 mEq/L (ref 19–32)
Calcium: 9.4 mg/dL (ref 8.4–10.5)
Creatinine, Ser: 0.68 mg/dL (ref 0.50–1.10)
GFR calc Af Amer: >90 mL/min (ref >90)
GFR calc non Af Amer: >90 mL/min (ref >90)
Total Protein: 6.7 g/dL (ref 6.0–8.3)

## 2012-07-15 LAB — CBC
Hemoglobin: 14.2 g/dL (ref 12.0–15.0)
MCH: 31 pg (ref 26.0–34.0)
RBC: 4.58 MIL/uL (ref 3.87–5.11)

## 2012-07-15 LAB — TROPONIN I: Troponin I: <0.3 ng/mL (ref <0.30)

## 2012-07-15 MED ORDER — ONDANSETRON HCL 4 MG/2ML IJ SOLN
4.0000 mg | Freq: Once | INTRAMUSCULAR | Status: AC
  Administered 2012-07-15: 4 mg via INTRAVENOUS
  Filled 2012-07-15: qty 2

## 2012-07-15 MED ORDER — GI COCKTAIL ~~LOC~~
30.0000 mL | Freq: Once | ORAL | Status: AC
  Administered 2012-07-15: 30 mL via ORAL
  Filled 2012-07-15: qty 30

## 2012-07-15 MED ORDER — FAMOTIDINE 20 MG PO TABS
20.0000 mg | ORAL_TABLET | Freq: Once | ORAL | Status: AC
  Administered 2012-07-15: 20 mg via ORAL
  Filled 2012-07-15: qty 1

## 2012-07-15 NOTE — ED Notes (Signed)
Family at bedside. Patient does not need anything at this time. Patient states she is ready to go home.

## 2012-07-15 NOTE — ED Notes (Addendum)
EMS gave patient ASA 324mg  (4 baby aspirin) and one NTG SL enroute per protocol.  Patient denies chest pain.  States she thinks she would feel much better if she had vomited,  Questioned about food intake - had a biscuit this am, nothing since.

## 2012-07-15 NOTE — ED Provider Notes (Signed)
History     CSN: [id number]  Arrival date & time 07/15/12  1943   First MD Initiated Contact with Patient 07/15/12 1959      Chief Complaint  Patient presents with  . Hypertension  . Chest Pain    (Consider location/radiation/quality/duration/timing/severity/associated sxs/prior treatment) Patient is a 61 y.o. female presenting with hypertension and chest pain. The history is provided by the patient.  Hypertension Associated symptoms include chest pain and headaches. Pertinent negatives include no abdominal pain and no shortness of breath.  Chest Pain Pertinent negatives for primary symptoms include no fever, no shortness of breath, no cough, no palpitations, no abdominal pain and no vomiting.  Pertinent negatives for associated symptoms include no numbness and no weakness.  Her past medical history is significant for hypertension.   pt states onset yesterday evening with frontal headache. Notes hx migraines, states the headache was same, typical of her prior headaches. States hx headaches x many years but worse after head injury 4 yrs ago. States since then has had workup of headaches including mri, neg acute. Pt states had also checked her bp and it was high 180/ range, so she went to primary care office for both headache and high bp evaluation today. Once there, states was given shot of toradol and phenergan for pain and very soon afterwards became nauseated. Felt as if had to vomit, but nothing would come up. Then noted dull lower, midline cp. Constant. Dull. Hx gerd. Denies hx cad. States family hx cad, but that she hasnt had cardiac eval previously. No other recent cp or discomfort, even w exertion. No diaphoresis. Pain not pleuritic. No leg pain or swelling. Denies cough or uri c/o. No fever or chills. No abd pain. w headache, right frontal, gradual onset ,constant. No eye pain or change in vision. No neck pain or stiffness. No change in speech. No problems w balance or coordination.  No numbness/weakness. States earlier headache all but resolved.     Past Medical History  Diagnosis Date  . Back pain   . Urinary tract infection   . Hypercholesteremia   . Anxiety   . Panic attacks   . Restless leg     Past Surgical History  Procedure Date  . Cholecystectomy   . Tubal ligation   . Cesarean section     Family History  Problem Relation Age of Onset  . Heart failure Mother   . Cancer Father     History  Substance Use Topics  . Smoking status: Never Smoker   . Smokeless tobacco: Not on file  . Alcohol Use: Yes     occ    OB History    Grav Para Term Preterm Abortions TAB SAB Ect Mult Living                  Review of Systems  Constitutional: Negative for fever and chills.  HENT: Negative for neck pain and neck stiffness.   Eyes: Negative for visual disturbance.  Respiratory: Negative for cough and shortness of breath.   Cardiovascular: Positive for chest pain. Negative for palpitations and leg swelling.  Gastrointestinal: Negative for vomiting and abdominal pain.  Genitourinary: Negative for flank pain.  Musculoskeletal: Negative for back pain.  Skin: Negative for rash.  Neurological: Positive for headaches. Negative for weakness and numbness.  Hematological: Does not bruise/bleed easily.  Psychiatric/Behavioral: Negative for confusion.    Allergies  Morphine  Home Medications   Current Outpatient Rx  Name Route Sig Dispense Refill  .  ASPIRIN-ACETAMINOPHEN-CAFFEINE 250-250-65 MG PO TABS Oral Take 1 tablet by mouth every 6 (six) hours as needed. migraines    . ATORVASTATIN CALCIUM 20 MG PO TABS Oral Take 20 mg by mouth at bedtime.    . CHOLECALCIFEROL 5000 UNITS PO CAPS Oral Take 5,000 Units by mouth at bedtime.    Marland Kitchen CLONAZEPAM 1 MG PO TABS Oral Take 1 mg by mouth at bedtime.     Marland Kitchen FLUTICASONE PROPIONATE 50 MCG/ACT NA SUSP Nasal Place 2 sprays into the nose daily as needed. Allergies    . HYDROCODONE-ACETAMINOPHEN 5-325 MG PO TABS  1 or  2 po q4h prn pain 15 tablet 0  . METHOCARBAMOL 500 MG PO TABS  2 po tid for spasm 30 tablet 0  . OMEGA-3-ACID ETHYL ESTERS 1 G PO CAPS Oral Take 1 g by mouth 2 (two) times daily.    Marland Kitchen PAROXETINE HCL 10 MG PO TABS Oral Take 10 mg by mouth at bedtime.       BP 142/82  Pulse 72  Temp 98.1 F (36.7 C) (Oral)  Resp 18  Ht 5\' 5"  (1.651 m)  Wt 154 lb (69.854 kg)  BMI 25.63 kg/m2  SpO2 95%  Physical Exam  Nursing note and vitals reviewed. Constitutional: She is oriented to person, place, and time. She appears well-developed and well-nourished. No distress.  HENT:  Head: Atraumatic.  Nose: Nose normal.  Mouth/Throat: Oropharynx is clear and moist.       No sinus or temporal tenderness.  Eyes: Conjunctivae normal and EOM are normal. Pupils are equal, round, and reactive to light. No scleral icterus.  Neck: Neck supple. No tracheal deviation present. No thyromegaly present.       No stiffness or rigidity.   Cardiovascular: Normal rate, regular rhythm, normal heart sounds and intact distal pulses.  Exam reveals no gallop and no friction rub.   No murmur heard. Pulmonary/Chest: Effort normal and breath sounds normal. No respiratory distress. She exhibits no tenderness.  Abdominal: Soft. Normal appearance and bowel sounds are normal. She exhibits no distension. There is no tenderness.  Genitourinary:       No cva tenderness.  Musculoskeletal: Normal range of motion. She exhibits no edema and no tenderness.  Neurological: She is alert and oriented to person, place, and time. No cranial nerve deficit.       Motor intact bilaterally. Steady gait.   Skin: Skin is warm and dry. No rash noted. She is not diaphoretic.  Psychiatric:       Anxious.     ED Course  Procedures (including critical care time)   Labs Reviewed  TROPONIN I  COMPREHENSIVE METABOLIC PANEL  CBC   Results for orders placed during the hospital encounter of 07/15/12  TROPONIN I      Component Value Range   Troponin I  <0.30  <0.30 ng/mL  COMPREHENSIVE METABOLIC PANEL      Component Value Range   Sodium 135  135 - 145 mEq/L   Potassium 3.7  3.5 - 5.1 mEq/L   Chloride 101  96 - 112 mEq/L   CO2 22  19 - 32 mEq/L   Glucose, Bld 144 (*) 70 - 99 mg/dL   BUN 11  6 - 23 mg/dL   Creatinine, Ser 1.61  0.50 - 1.10 mg/dL   Calcium 9.4  8.4 - 09.6 mg/dL   Total Protein 6.7  6.0 - 8.3 g/dL   Albumin 3.8  3.5 - 5.2 g/dL   AST 25  0 - 37 U/L   ALT 32  0 - 35 U/L   Alkaline Phosphatase 88  39 - 117 U/L   Total Bilirubin 0.3  0.3 - 1.2 mg/dL   GFR calc non Af Amer >90  >90 mL/min   GFR calc Af Amer >90  >90 mL/min  CBC      Component Value Range   WBC 9.0  4.0 - 10.5 K/uL   RBC 4.58  3.87 - 5.11 MIL/uL   Hemoglobin 14.2  12.0 - 15.0 g/dL   HCT 52.8  41.3 - 24.4 %   MCV 89.5  78.0 - 100.0 fL   MCH 31.0  26.0 - 34.0 pg   MCHC 34.6  30.0 - 36.0 g/dL   RDW 01.0  27.2 - 53.6 %   Platelets 408 (*) 150 - 400 K/uL   Dg Chest 2 View  07/15/2012  *RADIOLOGY REPORT*  Clinical Data: Mid chest pain.  Nausea.  CHEST - 2 VIEW  Comparison: 06/11/2005.  Findings: Normal sized heart.  Clear lungs with normal vascularity. Unremarkable bones.  IMPRESSION: No acute abnormality.   Original Report Authenticated By: Darrol Angel, M.D.       MDM  Iv ns. Monitor. Labs.   Reviewed nursing notes and prior charts for additional history.   Pepcid, gi cocktail, zofran.     Date: 07/15/2012  Rate: 72  Rhythm: normal sinus rhythm  QRS Axis: normal  Intervals: normal  ST/T Wave abnormalities: nonspecific T wave changes  Conduction Disutrbances:none  Narrative Interpretation:   Old EKG Reviewed: none available  Relief of symptoms w gi meds.  Pt denies any other recent cp or discomfort even w exertion. No unusual doe or fatigue. Chest discomfort tonight only w acute episode nv post meds for headache.   On recheck no headache, no chest discomfort, no sob.   Discussed w pt, given risk factors/primarily fam hx cad,  recommend close pcp and card f/u, pt requests referral to Dr Diona Browner who spouse sees, states also has appt to see her pcp, Dr Margo Aye, this Monday.   Pt appears comfortable, states feels ready for d/c home, appears stable for d/c.       Suzi Roots, MD 07/15/12 2125

## 2012-07-15 NOTE — ED Notes (Signed)
MD at bedside. 

## 2012-07-15 NOTE — ED Notes (Signed)
Patient presents to ER via EMS with c/o hypertension and chest pain.  Patient was sent to ER by [facility].  Patient was seen today for hypertension and headache.  Patient was given Phenergan and Toradol and then she developed chest pain.  Patient denies chest pain at this time; states she thinks her chest hurt because she couldn't vomit.

## 2012-07-20 ENCOUNTER — Encounter (HOSPITAL_COMMUNITY): Payer: Self-pay | Admitting: Emergency Medicine

## 2012-07-20 ENCOUNTER — Emergency Department (HOSPITAL_COMMUNITY)
Admission: EM | Admit: 2012-07-20 | Discharge: 2012-07-20 | Disposition: A | Discharge status: Home / Self Care | Payer: 59 | Attending: Emergency Medicine | Admitting: Emergency Medicine

## 2012-07-20 DIAGNOSIS — F411 Generalized anxiety disorder: Secondary | ICD-10-CM | POA: Insufficient documentation

## 2012-07-20 DIAGNOSIS — R110 Nausea: Secondary | ICD-10-CM | POA: Insufficient documentation

## 2012-07-20 DIAGNOSIS — R6883 Chills (without fever): Secondary | ICD-10-CM | POA: Insufficient documentation

## 2012-07-20 DIAGNOSIS — E7800 Pure hypercholesterolemia, unspecified: Secondary | ICD-10-CM | POA: Insufficient documentation

## 2012-07-20 DIAGNOSIS — R059 Cough, unspecified: Secondary | ICD-10-CM | POA: Insufficient documentation

## 2012-07-20 DIAGNOSIS — IMO0001 Reserved for inherently not codable concepts without codable children: Secondary | ICD-10-CM | POA: Insufficient documentation

## 2012-07-20 DIAGNOSIS — Z79899 Other long term (current) drug therapy: Secondary | ICD-10-CM | POA: Insufficient documentation

## 2012-07-20 DIAGNOSIS — R079 Chest pain, unspecified: Secondary | ICD-10-CM | POA: Insufficient documentation

## 2012-07-20 DIAGNOSIS — R05 Cough: Secondary | ICD-10-CM | POA: Insufficient documentation

## 2012-07-20 MED ORDER — OXYCODONE-ACETAMINOPHEN 5-325 MG PO TABS
2.0000 | ORAL_TABLET | Freq: Once | ORAL | Status: AC
  Administered 2012-07-20: 2 via ORAL
  Filled 2012-07-20: qty 2

## 2012-07-20 MED ORDER — ONDANSETRON 8 MG PO TBDP: 8.0000 mg | ORAL_TABLET | Freq: Three times a day (TID) | ORAL | Status: DC | PRN

## 2012-07-20 MED ORDER — ONDANSETRON 8 MG PO TBDP
8.0000 mg | ORAL_TABLET | Freq: Once | ORAL | Status: AC
  Administered 2012-07-20: 8 mg via ORAL
  Filled 2012-07-20: qty 1

## 2012-07-20 MED ORDER — ALBUTEROL SULFATE HFA 108 (90 BASE) MCG/ACT IN AERS
2.0000 | INHALATION_SPRAY | RESPIRATORY_TRACT | Status: DC | PRN
Start: 2012-07-20 — End: 2012-07-20
  Administered 2012-07-20: 2 via RESPIRATORY_TRACT
  Filled 2012-07-20: qty 6.7

## 2012-07-20 NOTE — ED Notes (Signed)
Pt ambulated in hallway with minimal assistance. Pt stated she became nauseated and felt "weird" while ambulating. Pt was dry heaving when she arrived back in room. MD notified.

## 2012-07-20 NOTE — ED Provider Notes (Signed)
History  This chart was scribed for Colleen Kruer, MD by Albertha Ghee Rifaie. This patient was seen in room APA06/APA06 and the patient's care was started at 3:40PM.   CSN: [id number]  Arrival date & time 07/20/12  1355   First MD Initiated Contact with Patient 07/20/12 1505      Chief Complaint  Patient presents with  . Generalized Body Aches    The history is provided by the patient. No language interpreter was used.    Colleen Nash is a 61 y.o. female who presents to the Emergency Department complaining of  6 days of sinus infection associated with 5 days of coughing that is progressively worsening. Pt also states having CP that is worsening with taking deep breath and with cough. She states having chills, fever, general body pain, and nausea as associated symptoms. She denies any extremities swelling, LOC, and shaking. Pt reports seeing Primary care on Friday and they sent her by ambulance to the ED for HTN. She also followed up with her PCP on Monday. She denies having any pain medication at home. Pt denies smoking but is an occasional alcohol user.     Past Medical History  Diagnosis Date  . Back pain   . Urinary tract infection   . Hypercholesteremia   . Anxiety   . Panic attacks   . Restless leg     Past Surgical History  Procedure Date  . Cholecystectomy   . Tubal ligation   . Cesarean section     Family History  Problem Relation Age of Onset  . Heart failure Mother   . Cancer Father     History  Substance Use Topics  . Smoking status: Never Smoker   . Smokeless tobacco: Not on file  . Alcohol Use: Yes     occ    No OB history provided.  Review of Systems  All other systems reviewed and are negative.    Allergies  Morphine  Home Medications   Current Outpatient Rx  Name Route Sig Dispense Refill  . ASPIRIN-ACETAMINOPHEN-CAFFEINE 250-250-65 MG PO TABS Oral Take 1-2 tablets by mouth every 6 (six) hours as needed. migraines    .  AZITHROMYCIN 250 MG PO TABS Oral Take 250 mg by mouth as directed. *Take two tablets by mouth on day 1, then take one tablet daily for 4 days*    . BUPROPION HCL ER (SR) 150 MG PO TB12 Oral Take 150 mg by mouth 2 (two) times daily.    . CHOLECALCIFEROL 5000 UNITS PO CAPS Oral Take 5,000 Units by mouth at bedtime.    Marland Kitchen CLONAZEPAM 1 MG PO TABS Oral Take 1 mg by mouth at bedtime as needed. For sleep    . FLUTICASONE PROPIONATE 50 MCG/ACT NA SUSP Nasal Place 2 sprays into the nose daily as needed. Allergies    . GUAIFENESIN-CODEINE 100-10 MG/5ML PO SYRP Oral Take 5 mLs by mouth every 4 (four) hours as needed. *May take every 4 to 6 hours as needed for cough*    . BENGAY EX Apply externally Apply 1 application topically daily as needed. For pain    . FISH OIL TRIPLE STRENGTH PO Oral Take 1 capsule by mouth 2 (two) times daily.    . TETRAHYDROZOLINE HCL 0.05 % OP SOLN Both Eyes Place 1 drop into both eyes daily as needed. For pain      BP 124/70  Pulse 89  Temp 97.7 F (36.5 C) (Oral)  Resp 16  Ht 5\' 5"  (1.651 m)  Wt 147 lb (66.679 kg)  BMI 24.46 kg/m2  SpO2 100%  Physical Exam  CONSTITUTIONAL: Well developed/well nourished HEAD AND FACE: Normocephalic/atraumatic EYES: EOMI/PERRL ENMT: Mucous membranes moist, nasal congestion NECK: supple no meningeal signs SPINE:entire spine nontender CV: S1/S2 noted, no murmurs/rubs/gallops noted LUNGS: Lungs are clear to auscultation bilaterally, no apparent distress ABDOMEN: soft, nontender, no rebound or guarding GU:no cva tenderness NEURO: Pt is awake/alert, moves all extremitiesx4 EXTREMITIES: pulses normal, full ROM SKIN: warm, color normal PSYCH: no abnormalities of mood noted  ED Course  Procedures   DIAGNOSTIC STUDIES: Oxygen Saturation is 100% on room air, normal by my interpretation.    COORDINATION OF CARE: 3:52 PM Discussed treatment plan with pt at bedside and pt agreed to plan.  Pt with recent cough/congestion reports she has  diffuse body aches and neck/back pain from her cough.  Her cough is nonproductive.  She reports nausea from cough as well.  No SOB noted.  She is ambulatory without any dyspnea noted.  She reports improvement after taking albuterol.  Suspect viral syndrome as cough/congestion/myaglias.  She is requesting d/c home.  Recent ED workup noted.  I doubt other acute cardiovascular/pulmonary process at this time   MDM  Nursing notes including past medical history and social history reviewed and considered in documentation Previous records reviewed and considered - recent ED visit reviewed       Date: 07/20/2012  Rate: 95  Rhythm: normal sinus rhythm  QRS Axis: normal  Intervals: normal  ST/T Wave abnormalities: nonspecific ST changes  Conduction Disutrbances:none  Narrative Interpretation:   Old EKG Reviewed: unchanged    I personally performed the services described in this documentation, which was scribed in my presence. The recorded information has been reviewed and considered.           Colleen Harl, MD 07/20/12 1730

## 2012-07-20 NOTE — ED Notes (Signed)
Pt c/o generalized body aches, cough x1 week. Pt has been seen by PCP and rx meds but no relief has been met. Pt also c/o nausea and dry mouth but no vomiting reported.

## 2012-07-20 NOTE — ED Notes (Signed)
Pt reports body ache and sinus infection starting 2 days ago. Patient reports emesis just now 1420. Patient reports being treated for nerve damaged in lower back. Patient reports generalized pain chest, legs, abdomen, and back.

## 2012-09-28 ENCOUNTER — Other Ambulatory Visit (HOSPITAL_COMMUNITY): Payer: Self-pay | Admitting: Internal Medicine

## 2012-09-28 DIAGNOSIS — Z139 Encounter for screening, unspecified: Secondary | ICD-10-CM

## 2012-10-11 ENCOUNTER — Ambulatory Visit (HOSPITAL_COMMUNITY)
Admission: RE | Admit: 2012-10-11 | Discharge: 2012-10-11 | Disposition: A | Discharge status: Home / Self Care | Payer: 59 | Source: Ambulatory Visit | Attending: Internal Medicine | Admitting: Internal Medicine

## 2012-10-11 DIAGNOSIS — Z139 Encounter for screening, unspecified: Secondary | ICD-10-CM

## 2012-10-11 DIAGNOSIS — Z1231 Encounter for screening mammogram for malignant neoplasm of breast: Secondary | ICD-10-CM | POA: Insufficient documentation

## 2012-10-19 ENCOUNTER — Other Ambulatory Visit: Payer: Self-pay | Admitting: Internal Medicine

## 2012-10-19 DIAGNOSIS — R928 Other abnormal and inconclusive findings on diagnostic imaging of breast: Secondary | ICD-10-CM

## 2012-11-02 ENCOUNTER — Ambulatory Visit (HOSPITAL_COMMUNITY)
Admission: RE | Admit: 2012-11-02 | Discharge: 2012-11-02 | Disposition: A | Discharge status: Home / Self Care | Payer: 59 | Source: Ambulatory Visit | Attending: Internal Medicine | Admitting: Internal Medicine

## 2012-11-02 DIAGNOSIS — R928 Other abnormal and inconclusive findings on diagnostic imaging of breast: Secondary | ICD-10-CM

## 2013-10-27 ENCOUNTER — Other Ambulatory Visit (HOSPITAL_COMMUNITY): Payer: Self-pay | Admitting: Internal Medicine

## 2013-10-27 DIAGNOSIS — Z139 Encounter for screening, unspecified: Secondary | ICD-10-CM

## 2013-11-13 ENCOUNTER — Ambulatory Visit (HOSPITAL_COMMUNITY)
Admission: RE | Admit: 2013-11-13 | Discharge: 2013-11-13 | Disposition: A | Discharge status: Home / Self Care | Payer: 59 | Source: Ambulatory Visit | Attending: Internal Medicine | Admitting: Internal Medicine

## 2013-11-13 ENCOUNTER — Ambulatory Visit (HOSPITAL_COMMUNITY): Payer: 59

## 2013-11-13 DIAGNOSIS — Z139 Encounter for screening, unspecified: Secondary | ICD-10-CM

## 2013-11-13 DIAGNOSIS — Z1231 Encounter for screening mammogram for malignant neoplasm of breast: Secondary | ICD-10-CM | POA: Insufficient documentation

## 2014-02-10 DIAGNOSIS — R079 Chest pain, unspecified: Secondary | ICD-10-CM | POA: Diagnosis not present

## 2014-02-10 DIAGNOSIS — M542 Cervicalgia: Secondary | ICD-10-CM | POA: Diagnosis not present

## 2014-02-10 DIAGNOSIS — S298XXA Other specified injuries of thorax, initial encounter: Secondary | ICD-10-CM | POA: Diagnosis not present

## 2014-02-10 DIAGNOSIS — S0993XA Unspecified injury of face, initial encounter: Secondary | ICD-10-CM | POA: Diagnosis not present

## 2014-02-10 DIAGNOSIS — R109 Unspecified abdominal pain: Secondary | ICD-10-CM | POA: Diagnosis not present

## 2014-02-10 DIAGNOSIS — S060X0A Concussion without loss of consciousness, initial encounter: Secondary | ICD-10-CM | POA: Diagnosis not present

## 2014-02-10 DIAGNOSIS — S20219A Contusion of unspecified front wall of thorax, initial encounter: Secondary | ICD-10-CM | POA: Diagnosis not present

## 2014-02-10 DIAGNOSIS — R5383 Other fatigue: Secondary | ICD-10-CM | POA: Diagnosis not present

## 2014-02-10 DIAGNOSIS — T148XXA Other injury of unspecified body region, initial encounter: Secondary | ICD-10-CM | POA: Diagnosis not present

## 2014-02-10 DIAGNOSIS — R55 Syncope and collapse: Secondary | ICD-10-CM | POA: Diagnosis not present

## 2014-02-10 DIAGNOSIS — S3500XA Unspecified injury of abdominal aorta, initial encounter: Secondary | ICD-10-CM | POA: Diagnosis not present

## 2014-02-10 DIAGNOSIS — R5381 Other malaise: Secondary | ICD-10-CM | POA: Diagnosis not present

## 2014-02-10 DIAGNOSIS — M549 Dorsalgia, unspecified: Secondary | ICD-10-CM | POA: Diagnosis not present

## 2014-02-10 DIAGNOSIS — Z885 Allergy status to narcotic agent status: Secondary | ICD-10-CM | POA: Diagnosis not present

## 2014-02-10 DIAGNOSIS — I719 Aortic aneurysm of unspecified site, without rupture: Secondary | ICD-10-CM | POA: Diagnosis not present

## 2014-02-10 DIAGNOSIS — Z79899 Other long term (current) drug therapy: Secondary | ICD-10-CM | POA: Diagnosis not present

## 2014-02-10 DIAGNOSIS — F3289 Other specified depressive episodes: Secondary | ICD-10-CM | POA: Diagnosis not present

## 2014-02-10 DIAGNOSIS — T1490XA Injury, unspecified, initial encounter: Secondary | ICD-10-CM | POA: Diagnosis not present

## 2014-02-10 DIAGNOSIS — F329 Major depressive disorder, single episode, unspecified: Secondary | ICD-10-CM | POA: Diagnosis not present

## 2014-02-12 DIAGNOSIS — E782 Mixed hyperlipidemia: Secondary | ICD-10-CM | POA: Diagnosis not present

## 2014-02-12 DIAGNOSIS — S060X9A Concussion with loss of consciousness of unspecified duration, initial encounter: Secondary | ICD-10-CM | POA: Diagnosis not present

## 2014-02-12 DIAGNOSIS — S060XAA Concussion with loss of consciousness status unknown, initial encounter: Secondary | ICD-10-CM | POA: Diagnosis not present

## 2014-02-12 DIAGNOSIS — W19XXXA Unspecified fall, initial encounter: Secondary | ICD-10-CM | POA: Diagnosis not present

## 2014-02-12 DIAGNOSIS — R51 Headache: Secondary | ICD-10-CM | POA: Diagnosis not present

## 2014-02-26 ENCOUNTER — Encounter: Payer: Self-pay | Admitting: Vascular Surgery

## 2014-02-27 ENCOUNTER — Encounter: Payer: 59 | Admitting: Vascular Surgery

## 2014-02-28 ENCOUNTER — Encounter: Payer: Self-pay | Admitting: Vascular Surgery

## 2014-03-01 ENCOUNTER — Ambulatory Visit (INDEPENDENT_AMBULATORY_CARE_PROVIDER_SITE_OTHER): Payer: Medicare Other | Admitting: Vascular Surgery

## 2014-03-01 ENCOUNTER — Encounter (INDEPENDENT_AMBULATORY_CARE_PROVIDER_SITE_OTHER): Payer: Self-pay

## 2014-03-01 ENCOUNTER — Encounter: Payer: Self-pay | Admitting: Vascular Surgery

## 2014-03-01 VITALS — BP 137/84 | HR 88 | Ht 65.0 in | Wt 143.2 lb

## 2014-03-01 DIAGNOSIS — I714 Abdominal aortic aneurysm, without rupture, unspecified: Secondary | ICD-10-CM | POA: Insufficient documentation

## 2014-03-01 NOTE — Progress Notes (Signed)
[facility] [place] HISTORY AND PHYSICAL   History of Present Illness:  Patient is a 63 y.o. year old female who presents for evaluation of  Abdominal aortic aneurysm. This was incidentally found on a CT scan of the abdomen and pelvis after a recent fall. The patient states she does have family history of aneurysms. She states that 2 of her sisters had aneurysms but it is difficult to tell whether or not they had myocardial infarctions or true aneurysms from the history that she gets. She also apparently had a nephew that had some type of aneurysm. She currently denies any abdominal or back pain. She still has some left costal margin pain from her recent fall. Other medical problems include elevated cholesterol and anxiety. These are currently controlled. She denies history of smoking.  Past Medical History  Diagnosis Date  . Back pain   . Urinary tract infection   . Hypercholesteremia   . Anxiety   . Panic attacks   . Restless leg     Past Surgical History  Procedure Laterality Date  . Cholecystectomy    . Tubal ligation    . Cesarean section      Social History History  Substance Use Topics  . Smoking status: Never Smoker   . Smokeless tobacco: Not on file  . Alcohol Use: Yes     Comment: occ    Family History Family History  Problem Relation Age of Onset  . Heart failure Mother   . Heart disease Mother   . Cancer Father   . Heart disease Father   . Hyperlipidemia Sister   . Hypertension Sister   . Heart disease Sister     before age 54  . Varicose Veins Sister   . Heart attack Sister   . AAA (abdominal aortic aneurysm) Sister   . Heart attack Sister     Allergies  Allergies  Allergen Reactions  . Morphine Shortness Of Breath     Current Outpatient Prescriptions  Medication Sig Dispense Refill  . aspirin-acetaminophen-caffeine (EXCEDRIN MIGRAINE) 250-250-65 MG per tablet Take 1-2 tablets by mouth every 6 (six) hours as needed.  migraines      . buPROPion (WELLBUTRIN SR) 150 MG 12 hr tablet Take 150 mg by mouth 2 (two) times daily.      . Cholecalciferol 5000 UNITS capsule Take 5,000 Units by mouth at bedtime.      . clonazePAM (KLONOPIN) 1 MG tablet Take 1 mg by mouth at bedtime as needed. For sleep      . fenofibrate (TRICOR) 48 MG tablet Take 48 mg by mouth daily.      . Omega-3 Fatty Acids (FISH OIL TRIPLE STRENGTH PO) Take 1 capsule by mouth 2 (two) times daily.      Marland Kitchen azithromycin (ZITHROMAX Z-PAK) 250 MG tablet Take 250 mg by mouth as directed. *Take two tablets by mouth on day 1, then take one tablet daily for 4 days*      . fluticasone (FLONASE) 50 MCG/ACT nasal spray Place 2 sprays into the nose daily as needed. Allergies      . guaiFENesin-codeine (ROBITUSSIN AC) 100-10 MG/5ML syrup Take 5 mLs by mouth every 4 (four) hours as needed. *May take every 4 to 6 hours as needed for cough*      . Menthol, Topical Analgesic, (BENGAY EX) Apply 1 application topically daily as needed. For pain      . ondansetron (ZOFRAN ODT) 8 MG disintegrating tablet Take 1 tablet (  8 mg total) by mouth every 8 (eight) hours as needed for nausea.  20 tablet  0  . tetrahydrozoline (VISINE) 0.05 % ophthalmic solution Place 1 drop into both eyes daily as needed. For pain       No current facility-administered medications for this visit.    ROS:   General:  No weight loss, Fever, chills  HEENT: No recent headaches, no nasal bleeding, no visual changes, no sore throat  Neurologic: She does have dizziness when she stands up quickly, no blackouts, seizures. No recent symptoms of stroke or mini- stroke. No recent episodes of slurred speech, or temporary blindness.  Cardiac: No recent episodes of chest pain/pressure, no shortness of breath at rest.  No shortness of breath with exertion.  Denies history of atrial fibrillation or irregular heartbeat  Vascular: No history of rest pain in feet.  No history of claudication.  No history of  non-healing ulcer, No history of DVT   Pulmonary: No home oxygen, no productive cough, no hemoptysis,  No asthma or wheezing  Musculoskeletal:  [ ]  Arthritis, [ ]  Low back pain,  [ ]  Joint pain  Hematologic:No history of hypercoagulable state.  No history of easy bleeding.  No history of anemia  Gastrointestinal: No hematochezia or melena,  No gastroesophageal reflux, no trouble swallowing  Urinary: [ ]  chronic Kidney disease, [ ]  on HD - [ ]  MWF or [ ]  TTHS, [ ]  Burning with urination, [ ]  Frequent urination, [ ]  Difficulty urinating;   Skin: No rashes  Psychological: + history of anxiety,  No history of depression   Physical Examination  Filed Vitals:   03/01/14 0949  BP: 137/84  Pulse: 88  Height: 5\' 5"  (1.651 m)  Weight: 143 lb 3.2 oz (64.955 kg)  SpO2: 97%    Body mass index is 23.83 kg/(m^2).  General:  Alert and oriented, no acute distress HEENT: Normal Neck: No bruit or JVD Pulmonary: Clear to auscultation bilaterally Cardiac: Regular Rate and Rhythm without murmur Abdomen: Soft, non-tender, non-distended, no mass Skin: No rash Extremity Pulses:  2+ radial, brachial, femoral, dorsalis pedis, posterior tibial pulses bilaterally Musculoskeletal: No deformity or edema  Neurologic: Upper and lower extremity motor 5/5 and symmetric  DATA:  Report from her CT scan was reviewed today. Unfortunately images were not available for review. We will submit a request to obtain the actual CT images. Abdominal aorta was said to be dilated in the infrarenal segment to 2.3 cm.   ASSESSMENT:  Ectasia of the abdominal aorta versus aneurysm very small at this point less than 3 cm.   PLAN:  We will obtain the actual images for further review. The patient will followup in one year for repeat ultrasound of the abdominal aorta.  Ruta Hinds, MD [facility] [place] Office: [phone] Pager: [phone]

## 2014-03-01 NOTE — Addendum Note (Signed)
Addended by: Mena Goes on: 03/01/2014 02:04 PM   Modules accepted: Orders

## 2014-03-21 DIAGNOSIS — J329 Chronic sinusitis, unspecified: Secondary | ICD-10-CM | POA: Diagnosis not present

## 2014-03-21 DIAGNOSIS — H6590 Unspecified nonsuppurative otitis media, unspecified ear: Secondary | ICD-10-CM | POA: Diagnosis not present

## 2014-03-21 DIAGNOSIS — R42 Dizziness and giddiness: Secondary | ICD-10-CM | POA: Diagnosis not present

## 2014-04-12 ENCOUNTER — Other Ambulatory Visit (HOSPITAL_COMMUNITY): Payer: Self-pay | Admitting: Internal Medicine

## 2014-04-12 ENCOUNTER — Ambulatory Visit (HOSPITAL_COMMUNITY)
Admission: RE | Admit: 2014-04-12 | Discharge: 2014-04-12 | Disposition: A | Discharge status: Home / Self Care | Payer: Medicare Other | Source: Ambulatory Visit | Attending: Internal Medicine | Admitting: Internal Medicine

## 2014-04-12 DIAGNOSIS — M47812 Spondylosis without myelopathy or radiculopathy, cervical region: Secondary | ICD-10-CM | POA: Diagnosis not present

## 2014-04-12 DIAGNOSIS — E782 Mixed hyperlipidemia: Secondary | ICD-10-CM | POA: Diagnosis not present

## 2014-04-12 DIAGNOSIS — Y929 Unspecified place or not applicable: Secondary | ICD-10-CM | POA: Insufficient documentation

## 2014-04-12 DIAGNOSIS — S0993XA Unspecified injury of face, initial encounter: Secondary | ICD-10-CM | POA: Diagnosis not present

## 2014-04-12 DIAGNOSIS — S199XXA Unspecified injury of neck, initial encounter: Secondary | ICD-10-CM | POA: Diagnosis not present

## 2014-04-12 DIAGNOSIS — S2239XB Fracture of one rib, unspecified side, initial encounter for open fracture: Secondary | ICD-10-CM | POA: Insufficient documentation

## 2014-04-12 DIAGNOSIS — R7309 Other abnormal glucose: Secondary | ICD-10-CM | POA: Diagnosis not present

## 2014-04-12 DIAGNOSIS — M25511 Pain in right shoulder: Secondary | ICD-10-CM

## 2014-04-12 DIAGNOSIS — M542 Cervicalgia: Secondary | ICD-10-CM

## 2014-04-12 DIAGNOSIS — S46909A Unspecified injury of unspecified muscle, fascia and tendon at shoulder and upper arm level, unspecified arm, initial encounter: Secondary | ICD-10-CM | POA: Diagnosis not present

## 2014-04-12 DIAGNOSIS — K219 Gastro-esophageal reflux disease without esophagitis: Secondary | ICD-10-CM | POA: Diagnosis not present

## 2014-04-12 DIAGNOSIS — M25512 Pain in left shoulder: Secondary | ICD-10-CM

## 2014-04-12 DIAGNOSIS — S4980XA Other specified injuries of shoulder and upper arm, unspecified arm, initial encounter: Secondary | ICD-10-CM | POA: Diagnosis not present

## 2014-04-12 DIAGNOSIS — M5030 Other cervical disc degeneration, unspecified cervical region: Secondary | ICD-10-CM | POA: Diagnosis not present

## 2014-04-12 DIAGNOSIS — M19019 Primary osteoarthritis, unspecified shoulder: Secondary | ICD-10-CM | POA: Diagnosis not present

## 2014-04-12 DIAGNOSIS — M25519 Pain in unspecified shoulder: Secondary | ICD-10-CM | POA: Diagnosis not present

## 2014-04-12 DIAGNOSIS — W108XXA Fall (on) (from) other stairs and steps, initial encounter: Secondary | ICD-10-CM | POA: Insufficient documentation

## 2014-04-17 DIAGNOSIS — E782 Mixed hyperlipidemia: Secondary | ICD-10-CM | POA: Diagnosis not present

## 2014-04-17 DIAGNOSIS — IMO0002 Reserved for concepts with insufficient information to code with codable children: Secondary | ICD-10-CM | POA: Diagnosis not present

## 2014-04-17 DIAGNOSIS — R7309 Other abnormal glucose: Secondary | ICD-10-CM | POA: Diagnosis not present

## 2014-04-19 DIAGNOSIS — M25819 Other specified joint disorders, unspecified shoulder: Secondary | ICD-10-CM | POA: Diagnosis not present

## 2014-04-30 DIAGNOSIS — H6980 Other specified disorders of Eustachian tube, unspecified ear: Secondary | ICD-10-CM | POA: Diagnosis not present

## 2014-05-15 DIAGNOSIS — H9209 Otalgia, unspecified ear: Secondary | ICD-10-CM | POA: Diagnosis not present

## 2014-05-15 DIAGNOSIS — J309 Allergic rhinitis, unspecified: Secondary | ICD-10-CM | POA: Diagnosis not present

## 2014-05-16 DIAGNOSIS — M25819 Other specified joint disorders, unspecified shoulder: Secondary | ICD-10-CM | POA: Diagnosis not present

## 2014-05-26 DIAGNOSIS — M25519 Pain in unspecified shoulder: Secondary | ICD-10-CM | POA: Diagnosis not present

## 2014-06-05 DIAGNOSIS — M19019 Primary osteoarthritis, unspecified shoulder: Secondary | ICD-10-CM | POA: Diagnosis not present

## 2014-07-31 DIAGNOSIS — I10 Essential (primary) hypertension: Secondary | ICD-10-CM | POA: Diagnosis not present

## 2014-07-31 DIAGNOSIS — R8290 Unspecified abnormal findings in urine: Secondary | ICD-10-CM | POA: Diagnosis not present

## 2014-07-31 DIAGNOSIS — N3000 Acute cystitis without hematuria: Secondary | ICD-10-CM | POA: Diagnosis not present

## 2014-07-31 DIAGNOSIS — M545 Low back pain: Secondary | ICD-10-CM | POA: Diagnosis not present

## 2014-09-18 DIAGNOSIS — E785 Hyperlipidemia, unspecified: Secondary | ICD-10-CM | POA: Diagnosis not present

## 2014-09-18 DIAGNOSIS — R7301 Impaired fasting glucose: Secondary | ICD-10-CM | POA: Diagnosis not present

## 2014-09-20 DIAGNOSIS — D696 Thrombocytopenia, unspecified: Secondary | ICD-10-CM | POA: Diagnosis not present

## 2014-09-20 DIAGNOSIS — Z23 Encounter for immunization: Secondary | ICD-10-CM | POA: Diagnosis not present

## 2014-09-20 DIAGNOSIS — E782 Mixed hyperlipidemia: Secondary | ICD-10-CM | POA: Diagnosis not present

## 2014-09-20 DIAGNOSIS — R7301 Impaired fasting glucose: Secondary | ICD-10-CM | POA: Diagnosis not present

## 2014-11-09 DIAGNOSIS — R300 Dysuria: Secondary | ICD-10-CM | POA: Diagnosis not present

## 2014-12-17 DIAGNOSIS — Z6823 Body mass index (BMI) 23.0-23.9, adult: Secondary | ICD-10-CM | POA: Diagnosis not present

## 2014-12-17 DIAGNOSIS — J069 Acute upper respiratory infection, unspecified: Secondary | ICD-10-CM | POA: Diagnosis not present

## 2014-12-20 ENCOUNTER — Other Ambulatory Visit (HOSPITAL_COMMUNITY): Payer: Self-pay | Admitting: Internal Medicine

## 2014-12-20 DIAGNOSIS — Z1231 Encounter for screening mammogram for malignant neoplasm of breast: Secondary | ICD-10-CM

## 2014-12-26 ENCOUNTER — Ambulatory Visit (HOSPITAL_COMMUNITY)
Admission: RE | Admit: 2014-12-26 | Discharge: 2014-12-26 | Disposition: A | Discharge status: Home / Self Care | Payer: Medicare Other | Source: Ambulatory Visit | Attending: Internal Medicine | Admitting: Internal Medicine

## 2014-12-26 DIAGNOSIS — Z1231 Encounter for screening mammogram for malignant neoplasm of breast: Secondary | ICD-10-CM

## 2014-12-27 ENCOUNTER — Ambulatory Visit (HOSPITAL_COMMUNITY): Payer: Medicare Other

## 2015-01-01 DIAGNOSIS — M5431 Sciatica, right side: Secondary | ICD-10-CM | POA: Diagnosis not present

## 2015-01-01 DIAGNOSIS — Z6824 Body mass index (BMI) 24.0-24.9, adult: Secondary | ICD-10-CM | POA: Diagnosis not present

## 2015-01-07 DIAGNOSIS — E782 Mixed hyperlipidemia: Secondary | ICD-10-CM | POA: Diagnosis not present

## 2015-01-07 DIAGNOSIS — R7301 Impaired fasting glucose: Secondary | ICD-10-CM | POA: Diagnosis not present

## 2015-01-09 DIAGNOSIS — R7301 Impaired fasting glucose: Secondary | ICD-10-CM | POA: Diagnosis not present

## 2015-01-09 DIAGNOSIS — D696 Thrombocytopenia, unspecified: Secondary | ICD-10-CM | POA: Diagnosis not present

## 2015-01-09 DIAGNOSIS — E782 Mixed hyperlipidemia: Secondary | ICD-10-CM | POA: Diagnosis not present

## 2015-01-09 DIAGNOSIS — Z6824 Body mass index (BMI) 24.0-24.9, adult: Secondary | ICD-10-CM | POA: Diagnosis not present

## 2015-02-20 ENCOUNTER — Encounter: Payer: Self-pay | Admitting: Vascular Surgery

## 2015-02-21 ENCOUNTER — Ambulatory Visit (INDEPENDENT_AMBULATORY_CARE_PROVIDER_SITE_OTHER): Payer: Medicare Other | Admitting: Vascular Surgery

## 2015-02-21 ENCOUNTER — Ambulatory Visit (HOSPITAL_COMMUNITY)
Admission: RE | Admit: 2015-02-21 | Discharge: 2015-02-21 | Disposition: A | Discharge status: Home / Self Care | Payer: Medicare Other | Source: Ambulatory Visit | Attending: Vascular Surgery | Admitting: Vascular Surgery

## 2015-02-21 ENCOUNTER — Encounter: Payer: Self-pay | Admitting: Vascular Surgery

## 2015-02-21 ENCOUNTER — Other Ambulatory Visit: Payer: Self-pay | Admitting: *Deleted

## 2015-02-21 VITALS — BP 144/102 | HR 76 | Resp 18 | Ht 66.0 in | Wt 136.4 lb

## 2015-02-21 DIAGNOSIS — I714 Abdominal aortic aneurysm, without rupture, unspecified: Secondary | ICD-10-CM

## 2015-02-21 LAB — VAS US AAA DUPLEX
ABAODVEL: 85 cm/s
ABAOMAP: 2.1 cm
ABAOPAP: 2.1 cm
ABLILAP: 0.87 cm
ABLILVEL: 96 cm/s
Abdominal dist aorta AP: 1.6 cm
Abdominal dist aorta trans: 1.3 cm
Abdominal lt com iliac trans: 0.78 cm
Abdominal mid aorta trans: 1.9 cm
Abdominal mid aorta vel: 76 cm/s
Abdominal prox aorta vel: 66 cm/s
Abdominal rt com iliac AP: 1 cm
Abdominal rt com iliac trans: 1.4 cm
Abdominal rt com iliac vel: 76 cm/s

## 2015-02-21 NOTE — Progress Notes (Signed)
Filed Vitals:   02/21/15 1213 02/21/15 1218  BP: 135/95 144/102  Pulse: 76   Resp: 18   Height: 5\' 6"  (1.676 m)   Weight: 136 lb 6.4 oz (61.871 kg)   Body mass index is 22.03 kg/(m^2).

## 2015-02-21 NOTE — Progress Notes (Signed)
[facility] [place] HISTORY AND PHYSICAL    History of Present Illness:  Patient is a 64 y.o. year old female who presents for evaluation of  Abdominal aortic aneurysm. This was incidentally found on a CT scan of the abdomen and pelvis after a recent fall in 2015. The patient states she does have family history of aneurysms. She states that 2 of her sisters had aneurysms but it is difficult to tell whether or not they had myocardial infarctions or true aneurysms from the history that she gets. She also apparently had a nephew that had some type of aneurysm. She currently denies any abdominal or back pain. She still has some left costal margin pain from her recent fall. Other medical problems include elevated cholesterol and anxiety. These are currently controlled. She denies history of smoking.    Past Medical History  Diagnosis Date  . Back pain   . Urinary tract infection   . Hypercholesteremia   . Anxiety   . Panic attacks   . Restless leg    Past Surgical History  Procedure Laterality Date  . Cholecystectomy    . Tubal ligation    . Cesarean section      Social History History   Substance Use Topics   .  Smoking status:  Never Smoker    .  Smokeless tobacco:  Not on file   .  Alcohol Use:  Yes         Comment: occ     Family History Family History   Problem  Relation  Age of Onset   .  Heart failure  Mother     .  Heart disease  Mother     .  Cancer  Father     .  Heart disease  Father     .  Hyperlipidemia  Sister     .  Hypertension  Sister     .  Heart disease  Sister         before age 74   .  Varicose Veins  Sister     .  Heart attack  Sister     .  AAA (abdominal aortic aneurysm)  Sister     .  Heart attack  Sister       Allergies    Allergies   Allergen  Reactions   .  Morphine  Shortness Of Breath    Current Outpatient Prescriptions on File Prior to Visit  Medication Sig Dispense Refill  . aspirin-acetaminophen-caffeine  (EXCEDRIN MIGRAINE) 250-250-65 MG per tablet Take 1-2 tablets by mouth every 6 (six) hours as needed. migraines    . buPROPion (WELLBUTRIN SR) 150 MG 12 hr tablet Take 150 mg by mouth 2 (two) times daily.    . Cholecalciferol 5000 UNITS capsule Take 5,000 Units by mouth at bedtime.    . clonazePAM (KLONOPIN) 1 MG tablet Take 1 mg by mouth at bedtime as needed. For sleep    . fenofibrate (TRICOR) 48 MG tablet Take 48 mg by mouth daily.    . fluticasone (FLONASE) 50 MCG/ACT nasal spray Place 2 sprays into the nose daily as needed. Allergies    . guaiFENesin-codeine (ROBITUSSIN AC) 100-10 MG/5ML syrup Take 5 mLs by mouth every 4 (four) hours as needed. *May take every 4 to 6 hours as needed for cough*    . Menthol, Topical Analgesic, (BENGAY EX) Apply 1 application topically daily as needed. For pain    . Omega-3 Fatty Acids (  FISH OIL TRIPLE STRENGTH PO) Take 1 capsule by mouth 2 (two) times daily.    Marland Kitchen tetrahydrozoline (VISINE) 0.05 % ophthalmic solution Place 1 drop into both eyes daily as needed. For pain    . azithromycin (ZITHROMAX Z-PAK) 250 MG tablet Take 250 mg by mouth as directed. *Take two tablets by mouth on day 1, then take one tablet daily for 4 days*    . ondansetron (ZOFRAN ODT) 8 MG disintegrating tablet Take 1 tablet (8 mg total) by mouth every 8 (eight) hours as needed for nausea. (Patient not taking: Reported on 02/21/2015) 20 tablet 0   No current facility-administered medications on file prior to visit.    ROS:    General:  No weight loss, Fever, chills  Cardiac: No recent episodes of chest pain/pressure, no shortness of breath at rest.  No shortness of breath with exertion.  Denies history of atrial fibrillation or irregular heartbeat  Vascular: No history of rest pain in feet.  No history of claudication.  No history of non-healing ulcer, No history of DVT    Psychological: + history of anxiety,  No history of depression   Physical Examination    Filed Vitals:    02/21/15 1213 02/21/15 1218  BP: 135/95 144/102  Pulse: 76   Resp: 18   Height: 5\' 6"  (1.676 m)   Weight: 136 lb 6.4 oz (61.871 kg)    General:  Alert and oriented, no acute distress HEENT: Normal Neck: No bruit or JVD Pulmonary: Clear to auscultation bilaterally Cardiac: Regular Rate and Rhythm without murmur Abdomen: Soft, non-tender, non-distended, no mass Skin: No rash Extremity Pulses:  2+ radial, brachial, femoral, dorsalis pedis, posterior tibial pulses bilaterally Musculoskeletal: No deformity or edema     Neurologic: Upper and lower extremity motor 5/5 and symmetric  DATA:   Abdominal aorta was said to be dilated in the infrarenal segment to 2.3 cm on a CT scan 2015. Repeat abdominal aortic ultrasound today shows her aorta is 2.1 cm in diameter   ASSESSMENT:  essentially normal-appearing aorta on ultrasound today.   PLAN:  since the patient has a family history of abdominal aortic aneurysm she probably warrants at least one follow-up ultrasound in 5 years. Otherwise no evidence of aneurysm today.   Ruta Hinds, MD [facility] [place] Office: [phone] Pager: [phone]

## 2015-02-22 ENCOUNTER — Ambulatory Visit (HOSPITAL_COMMUNITY)
Admission: RE | Admit: 2015-02-22 | Discharge: 2015-02-22 | Disposition: A | Discharge status: Home / Self Care | Payer: Medicare Other | Source: Ambulatory Visit | Attending: Internal Medicine | Admitting: Internal Medicine

## 2015-02-22 ENCOUNTER — Other Ambulatory Visit (HOSPITAL_COMMUNITY): Payer: Self-pay | Admitting: Internal Medicine

## 2015-02-22 ENCOUNTER — Other Ambulatory Visit: Payer: Self-pay | Admitting: Family Medicine

## 2015-02-22 DIAGNOSIS — R109 Unspecified abdominal pain: Secondary | ICD-10-CM | POA: Diagnosis not present

## 2015-02-22 DIAGNOSIS — R1030 Lower abdominal pain, unspecified: Secondary | ICD-10-CM | POA: Diagnosis not present

## 2015-02-22 DIAGNOSIS — R1031 Right lower quadrant pain: Secondary | ICD-10-CM

## 2015-02-25 DIAGNOSIS — R102 Pelvic and perineal pain: Secondary | ICD-10-CM | POA: Diagnosis not present

## 2015-02-28 ENCOUNTER — Ambulatory Visit: Payer: Medicare Other | Admitting: Vascular Surgery

## 2015-02-28 ENCOUNTER — Other Ambulatory Visit (HOSPITAL_COMMUNITY): Payer: Medicare Other

## 2015-04-08 ENCOUNTER — Other Ambulatory Visit: Payer: Self-pay

## 2015-06-04 DIAGNOSIS — M5137 Other intervertebral disc degeneration, lumbosacral region: Secondary | ICD-10-CM | POA: Diagnosis not present

## 2015-06-10 DIAGNOSIS — E782 Mixed hyperlipidemia: Secondary | ICD-10-CM | POA: Diagnosis not present

## 2015-06-10 DIAGNOSIS — R7301 Impaired fasting glucose: Secondary | ICD-10-CM | POA: Diagnosis not present

## 2015-06-11 DIAGNOSIS — R7301 Impaired fasting glucose: Secondary | ICD-10-CM | POA: Diagnosis not present

## 2015-06-11 DIAGNOSIS — E782 Mixed hyperlipidemia: Secondary | ICD-10-CM | POA: Diagnosis not present

## 2015-06-11 DIAGNOSIS — F411 Generalized anxiety disorder: Secondary | ICD-10-CM | POA: Diagnosis not present

## 2015-08-06 DIAGNOSIS — R1084 Generalized abdominal pain: Secondary | ICD-10-CM | POA: Diagnosis not present

## 2015-08-31 DIAGNOSIS — Z23 Encounter for immunization: Secondary | ICD-10-CM | POA: Diagnosis not present

## 2015-10-22 DIAGNOSIS — M5431 Sciatica, right side: Secondary | ICD-10-CM | POA: Diagnosis not present

## 2015-11-01 DIAGNOSIS — M4727 Other spondylosis with radiculopathy, lumbosacral region: Secondary | ICD-10-CM | POA: Diagnosis not present

## 2015-11-01 DIAGNOSIS — M5116 Intervertebral disc disorders with radiculopathy, lumbar region: Secondary | ICD-10-CM | POA: Diagnosis not present

## 2015-11-01 DIAGNOSIS — M5115 Intervertebral disc disorders with radiculopathy, thoracolumbar region: Secondary | ICD-10-CM | POA: Diagnosis not present

## 2015-11-01 DIAGNOSIS — M5431 Sciatica, right side: Secondary | ICD-10-CM | POA: Diagnosis not present

## 2015-11-01 DIAGNOSIS — M4316 Spondylolisthesis, lumbar region: Secondary | ICD-10-CM | POA: Diagnosis not present

## 2015-11-01 DIAGNOSIS — M4726 Other spondylosis with radiculopathy, lumbar region: Secondary | ICD-10-CM | POA: Diagnosis not present

## 2015-11-08 DIAGNOSIS — M7061 Trochanteric bursitis, right hip: Secondary | ICD-10-CM | POA: Diagnosis not present

## 2015-11-08 DIAGNOSIS — M5431 Sciatica, right side: Secondary | ICD-10-CM | POA: Diagnosis not present

## 2015-11-08 DIAGNOSIS — M25551 Pain in right hip: Secondary | ICD-10-CM | POA: Diagnosis not present

## 2015-11-08 DIAGNOSIS — M461 Sacroiliitis, not elsewhere classified: Secondary | ICD-10-CM | POA: Diagnosis not present

## 2015-12-06 DIAGNOSIS — M5416 Radiculopathy, lumbar region: Secondary | ICD-10-CM | POA: Diagnosis not present

## 2015-12-09 DIAGNOSIS — R7301 Impaired fasting glucose: Secondary | ICD-10-CM | POA: Diagnosis not present

## 2015-12-09 DIAGNOSIS — E782 Mixed hyperlipidemia: Secondary | ICD-10-CM | POA: Diagnosis not present

## 2015-12-10 DIAGNOSIS — M545 Low back pain: Secondary | ICD-10-CM | POA: Diagnosis not present

## 2015-12-10 DIAGNOSIS — F411 Generalized anxiety disorder: Secondary | ICD-10-CM | POA: Diagnosis not present

## 2015-12-10 DIAGNOSIS — E782 Mixed hyperlipidemia: Secondary | ICD-10-CM | POA: Diagnosis not present

## 2015-12-10 DIAGNOSIS — R7301 Impaired fasting glucose: Secondary | ICD-10-CM | POA: Diagnosis not present

## 2015-12-12 DIAGNOSIS — M545 Low back pain: Secondary | ICD-10-CM | POA: Diagnosis not present

## 2015-12-12 DIAGNOSIS — M5416 Radiculopathy, lumbar region: Secondary | ICD-10-CM | POA: Diagnosis not present

## 2015-12-12 DIAGNOSIS — M5136 Other intervertebral disc degeneration, lumbar region: Secondary | ICD-10-CM | POA: Diagnosis not present

## 2015-12-28 DIAGNOSIS — J018 Other acute sinusitis: Secondary | ICD-10-CM | POA: Diagnosis not present

## 2015-12-28 DIAGNOSIS — R030 Elevated blood-pressure reading, without diagnosis of hypertension: Secondary | ICD-10-CM | POA: Diagnosis not present

## 2016-01-07 DIAGNOSIS — S8391XA Sprain of unspecified site of right knee, initial encounter: Secondary | ICD-10-CM | POA: Diagnosis not present

## 2016-01-14 DIAGNOSIS — M7061 Trochanteric bursitis, right hip: Secondary | ICD-10-CM | POA: Diagnosis not present

## 2016-01-14 DIAGNOSIS — M545 Low back pain: Secondary | ICD-10-CM | POA: Diagnosis not present

## 2016-01-14 DIAGNOSIS — M25561 Pain in right knee: Secondary | ICD-10-CM | POA: Diagnosis not present

## 2016-01-14 DIAGNOSIS — M25551 Pain in right hip: Secondary | ICD-10-CM | POA: Diagnosis not present

## 2016-01-14 DIAGNOSIS — M47816 Spondylosis without myelopathy or radiculopathy, lumbar region: Secondary | ICD-10-CM | POA: Diagnosis not present

## 2016-02-11 DIAGNOSIS — M1711 Unilateral primary osteoarthritis, right knee: Secondary | ICD-10-CM | POA: Diagnosis not present

## 2016-02-11 DIAGNOSIS — M7061 Trochanteric bursitis, right hip: Secondary | ICD-10-CM | POA: Diagnosis not present

## 2016-03-31 DIAGNOSIS — M25551 Pain in right hip: Secondary | ICD-10-CM | POA: Diagnosis not present

## 2016-03-31 DIAGNOSIS — M5441 Lumbago with sciatica, right side: Secondary | ICD-10-CM | POA: Diagnosis not present

## 2016-04-02 DIAGNOSIS — L820 Inflamed seborrheic keratosis: Secondary | ICD-10-CM | POA: Diagnosis not present

## 2016-04-07 DIAGNOSIS — M5441 Lumbago with sciatica, right side: Secondary | ICD-10-CM | POA: Diagnosis not present

## 2016-04-07 DIAGNOSIS — M5116 Intervertebral disc disorders with radiculopathy, lumbar region: Secondary | ICD-10-CM | POA: Diagnosis not present

## 2016-04-07 DIAGNOSIS — M47816 Spondylosis without myelopathy or radiculopathy, lumbar region: Secondary | ICD-10-CM | POA: Diagnosis not present

## 2016-04-07 DIAGNOSIS — M5431 Sciatica, right side: Secondary | ICD-10-CM | POA: Diagnosis not present

## 2016-04-07 DIAGNOSIS — M4726 Other spondylosis with radiculopathy, lumbar region: Secondary | ICD-10-CM | POA: Diagnosis not present

## 2016-04-07 DIAGNOSIS — M4727 Other spondylosis with radiculopathy, lumbosacral region: Secondary | ICD-10-CM | POA: Diagnosis not present

## 2016-04-09 DIAGNOSIS — M5441 Lumbago with sciatica, right side: Secondary | ICD-10-CM | POA: Diagnosis not present

## 2016-04-23 DIAGNOSIS — M5136 Other intervertebral disc degeneration, lumbar region: Secondary | ICD-10-CM | POA: Diagnosis not present

## 2016-04-23 DIAGNOSIS — M545 Low back pain: Secondary | ICD-10-CM | POA: Diagnosis not present

## 2016-04-29 DIAGNOSIS — M545 Low back pain: Secondary | ICD-10-CM | POA: Diagnosis not present

## 2016-04-29 DIAGNOSIS — M5136 Other intervertebral disc degeneration, lumbar region: Secondary | ICD-10-CM | POA: Diagnosis not present

## 2016-05-04 DIAGNOSIS — M5136 Other intervertebral disc degeneration, lumbar region: Secondary | ICD-10-CM | POA: Diagnosis not present

## 2016-05-04 DIAGNOSIS — M545 Low back pain: Secondary | ICD-10-CM | POA: Diagnosis not present

## 2016-05-06 DIAGNOSIS — M545 Low back pain: Secondary | ICD-10-CM | POA: Diagnosis not present

## 2016-05-06 DIAGNOSIS — M5136 Other intervertebral disc degeneration, lumbar region: Secondary | ICD-10-CM | POA: Diagnosis not present

## 2016-05-09 DIAGNOSIS — M5136 Other intervertebral disc degeneration, lumbar region: Secondary | ICD-10-CM | POA: Diagnosis not present

## 2016-05-09 DIAGNOSIS — M545 Low back pain: Secondary | ICD-10-CM | POA: Diagnosis not present

## 2016-05-11 DIAGNOSIS — M545 Low back pain: Secondary | ICD-10-CM | POA: Diagnosis not present

## 2016-05-11 DIAGNOSIS — M5136 Other intervertebral disc degeneration, lumbar region: Secondary | ICD-10-CM | POA: Diagnosis not present

## 2016-05-13 DIAGNOSIS — M5135 Other intervertebral disc degeneration, thoracolumbar region: Secondary | ICD-10-CM | POA: Diagnosis not present

## 2016-05-13 DIAGNOSIS — M545 Low back pain: Secondary | ICD-10-CM | POA: Diagnosis not present

## 2016-05-18 DIAGNOSIS — M5135 Other intervertebral disc degeneration, thoracolumbar region: Secondary | ICD-10-CM | POA: Diagnosis not present

## 2016-05-18 DIAGNOSIS — M545 Low back pain: Secondary | ICD-10-CM | POA: Diagnosis not present

## 2016-05-20 DIAGNOSIS — M5135 Other intervertebral disc degeneration, thoracolumbar region: Secondary | ICD-10-CM | POA: Diagnosis not present

## 2016-05-20 DIAGNOSIS — M545 Low back pain: Secondary | ICD-10-CM | POA: Diagnosis not present

## 2016-05-21 DIAGNOSIS — M5136 Other intervertebral disc degeneration, lumbar region: Secondary | ICD-10-CM | POA: Diagnosis not present

## 2016-05-21 DIAGNOSIS — M545 Low back pain: Secondary | ICD-10-CM | POA: Diagnosis not present

## 2016-05-22 DIAGNOSIS — M5135 Other intervertebral disc degeneration, thoracolumbar region: Secondary | ICD-10-CM | POA: Diagnosis not present

## 2016-05-22 DIAGNOSIS — M545 Low back pain: Secondary | ICD-10-CM | POA: Diagnosis not present

## 2016-05-26 DIAGNOSIS — M545 Low back pain: Secondary | ICD-10-CM | POA: Diagnosis not present

## 2016-05-26 DIAGNOSIS — M5135 Other intervertebral disc degeneration, thoracolumbar region: Secondary | ICD-10-CM | POA: Diagnosis not present

## 2016-06-02 DIAGNOSIS — M47816 Spondylosis without myelopathy or radiculopathy, lumbar region: Secondary | ICD-10-CM | POA: Diagnosis not present

## 2016-06-02 DIAGNOSIS — M4696 Unspecified inflammatory spondylopathy, lumbar region: Secondary | ICD-10-CM | POA: Diagnosis not present

## 2016-06-02 DIAGNOSIS — M5126 Other intervertebral disc displacement, lumbar region: Secondary | ICD-10-CM | POA: Diagnosis not present

## 2016-06-02 DIAGNOSIS — M5416 Radiculopathy, lumbar region: Secondary | ICD-10-CM | POA: Diagnosis not present

## 2016-06-02 DIAGNOSIS — M4806 Spinal stenosis, lumbar region: Secondary | ICD-10-CM | POA: Diagnosis not present

## 2016-06-02 DIAGNOSIS — M5116 Intervertebral disc disorders with radiculopathy, lumbar region: Secondary | ICD-10-CM | POA: Diagnosis not present

## 2016-06-05 DIAGNOSIS — K529 Noninfective gastroenteritis and colitis, unspecified: Secondary | ICD-10-CM | POA: Diagnosis not present

## 2016-06-09 ENCOUNTER — Other Ambulatory Visit: Payer: Self-pay

## 2016-06-11 DIAGNOSIS — M545 Low back pain: Secondary | ICD-10-CM | POA: Diagnosis not present

## 2016-06-11 DIAGNOSIS — M5416 Radiculopathy, lumbar region: Secondary | ICD-10-CM | POA: Diagnosis not present

## 2016-06-15 ENCOUNTER — Emergency Department (HOSPITAL_COMMUNITY): Payer: Medicare Other

## 2016-06-15 ENCOUNTER — Encounter (HOSPITAL_COMMUNITY): Payer: Self-pay

## 2016-06-15 ENCOUNTER — Emergency Department (HOSPITAL_COMMUNITY)
Admission: EM | Admit: 2016-06-15 | Discharge: 2016-06-15 | Disposition: A | Discharge status: Home / Self Care | Payer: Medicare Other | Attending: Emergency Medicine | Admitting: Emergency Medicine

## 2016-06-15 DIAGNOSIS — D75839 Thrombocytosis, unspecified: Secondary | ICD-10-CM

## 2016-06-15 DIAGNOSIS — M79661 Pain in right lower leg: Secondary | ICD-10-CM | POA: Diagnosis not present

## 2016-06-15 DIAGNOSIS — M25519 Pain in unspecified shoulder: Secondary | ICD-10-CM | POA: Diagnosis not present

## 2016-06-15 DIAGNOSIS — M79605 Pain in left leg: Secondary | ICD-10-CM | POA: Diagnosis not present

## 2016-06-15 DIAGNOSIS — M79604 Pain in right leg: Secondary | ICD-10-CM | POA: Insufficient documentation

## 2016-06-15 DIAGNOSIS — D473 Essential (hemorrhagic) thrombocythemia: Secondary | ICD-10-CM | POA: Diagnosis not present

## 2016-06-15 DIAGNOSIS — Z7982 Long term (current) use of aspirin: Secondary | ICD-10-CM | POA: Diagnosis not present

## 2016-06-15 DIAGNOSIS — Z79899 Other long term (current) drug therapy: Secondary | ICD-10-CM | POA: Insufficient documentation

## 2016-06-15 DIAGNOSIS — R030 Elevated blood-pressure reading, without diagnosis of hypertension: Secondary | ICD-10-CM | POA: Diagnosis not present

## 2016-06-15 LAB — CBC WITH DIFFERENTIAL/PLATELET
BASOS ABS: 0 10*3/uL (ref 0.0–0.1)
Basophils Relative: 0 %
EOS ABS: 0.1 10*3/uL (ref 0.0–0.7)
EOS PCT: 1 %
HCT: 37.1 % (ref 36.0–46.0)
Hemoglobin: 12.4 g/dL (ref 12.0–15.0)
Lymphocytes Relative: 19 %
Lymphs Abs: 2.1 10*3/uL (ref 0.7–4.0)
MCH: 30.3 pg (ref 26.0–34.0)
MCHC: 33.4 g/dL (ref 30.0–36.0)
MCV: 90.7 fL (ref 78.0–100.0)
Monocytes Absolute: 1.5 10*3/uL — ABNORMAL HIGH (ref 0.1–1.0)
Monocytes Relative: 13 %
Neutro Abs: 7.6 10*3/uL (ref 1.7–7.7)
Neutrophils Relative %: 67 %
PLATELETS: 718 10*3/uL — AB (ref 150–400)
RBC: 4.09 MIL/uL (ref 3.87–5.11)
RDW: 11.8 % (ref 11.5–15.5)
WBC: 11.3 10*3/uL — ABNORMAL HIGH (ref 4.0–10.5)

## 2016-06-15 LAB — URINALYSIS, ROUTINE W REFLEX MICROSCOPIC
BILIRUBIN URINE: NEGATIVE
Glucose, UA: NEGATIVE mg/dL
Hgb urine dipstick: NEGATIVE
Ketones, ur: NEGATIVE mg/dL
Leukocytes, UA: NEGATIVE
Nitrite: NEGATIVE
PH: 6.5 (ref 5.0–8.0)
Protein, ur: NEGATIVE mg/dL
Specific Gravity, Urine: <1.005 — ABNORMAL LOW (ref 1.005–1.030)

## 2016-06-15 LAB — COMPREHENSIVE METABOLIC PANEL
ALT: 17 U/L (ref 14–54)
AST: 13 U/L — AB (ref 15–41)
Albumin: 3.4 g/dL — ABNORMAL LOW (ref 3.5–5.0)
Alkaline Phosphatase: 55 U/L (ref 38–126)
Anion gap: 9 (ref 5–15)
BUN: 10 mg/dL (ref 6–20)
CHLORIDE: 104 mmol/L (ref 101–111)
CO2: 24 mmol/L (ref 22–32)
CREATININE: 0.71 mg/dL (ref 0.44–1.00)
Calcium: 8.9 mg/dL (ref 8.9–10.3)
GFR calc Af Amer: >60 mL/min (ref >60)
GFR calc non Af Amer: >60 mL/min (ref >60)
Glucose, Bld: 115 mg/dL — ABNORMAL HIGH (ref 65–99)
POTASSIUM: 3.7 mmol/L (ref 3.5–5.1)
SODIUM: 137 mmol/L (ref 135–145)
Total Bilirubin: 0.7 mg/dL (ref 0.3–1.2)
Total Protein: 6.6 g/dL (ref 6.5–8.1)

## 2016-06-15 LAB — CK: Total CK: 19 U/L — ABNORMAL LOW (ref 38–234)

## 2016-06-15 MED ORDER — DICLOFENAC SODIUM 1 % TD GEL: 2.0000 g | Freq: Four times a day (QID) | TRANSDERMAL | 0 refills | Status: DC

## 2016-06-15 MED ORDER — SODIUM CHLORIDE 0.9 % IV BOLUS (SEPSIS)
1000.0000 mL | Freq: Once | INTRAVENOUS | Status: AC
Start: 2016-06-15 — End: 2016-06-15
  Administered 2016-06-15: 1000 mL via INTRAVENOUS

## 2016-06-15 MED ORDER — FENTANYL CITRATE (PF) 100 MCG/2ML IJ SOLN
50.0000 ug | Freq: Once | INTRAMUSCULAR | Status: AC
  Administered 2016-06-15: 50 ug via INTRAVENOUS
  Filled 2016-06-15: qty 2

## 2016-06-15 MED ORDER — HYDROCODONE-ACETAMINOPHEN 5-325 MG PO TABS: 2.0000 | ORAL_TABLET | ORAL | 0 refills | Status: DC | PRN

## 2016-06-15 NOTE — Discharge Instructions (Signed)
Apply cream every 6 hours as needed for pain Hydrocodone with acetaminophen for severe pani See your doctor for further evaluation this week See your attached results.

## 2016-06-15 NOTE — ED Notes (Signed)
Patient transported to CT 

## 2016-06-15 NOTE — ED Notes (Signed)
Returned from CT.

## 2016-06-15 NOTE — ED Provider Notes (Signed)
[facility] DEPT Provider Note   CSN: [id number] Arrival date & time: 06/15/16  0720     History   Chief Complaint Chief Complaint  Patient presents with  . Leg Pain    HPI Colleen Nash is a 65 y.o. female.  The patient is a 65 year old female, she has a medical history involving back pain status post epidural, she had a fall approximately 2 years ago during which time she injured her ribs, bilateral arms, bilateral legs but is unable to tell me the exact injuries that occurred other than the rib fractures. She states that since that time she has had chronic pain in her arms and her legs and her back, she has had difficulty with ambulating intermittently, since starting physical therapy at a small hospital in [place] she has developed severe pain especially in her leg on the right and less so on the left and is limiting her ability to ambulate. She reports that she has an appointment at [facility] on Thursday. She expresses periods where she becomes hot and cold and thinking that she has a fever but has not taken her temperature. She denies any urinary symptoms, respiratory symptoms or rashes and states that after she got her epidural shot her back pain almost completely resolved area and it was shortly after that that she developed the pain in her right leg. She points to her right inguinal area as to the source of the pain, denies redness or swelling in that area, denies numbness in the leg though she states that the leg feels very heavy.    Leg Pain      Past Medical History:  Diagnosis Date  . Anxiety   . Back pain   . Hypercholesteremia   . Panic attacks   . Restless leg   . Urinary tract infection     Patient Active Problem List   Diagnosis Date Noted  . Abdominal aneurysm without mention of rupture 03/01/2014  . DYSPEPSIA 06/05/2009    Past Surgical History:  Procedure Laterality Date  . CESAREAN SECTION    . CHOLECYSTECTOMY    . TUBAL LIGATION      OB  History    No data available       Home Medications    Prior to Admission medications   Medication Sig Start Date End Date Taking? Authorizing Provider  aspirin-acetaminophen-caffeine (EXCEDRIN MIGRAINE) [phone] MG per tablet Take 1-2 tablets by mouth every 6 (six) hours as needed. migraines   Yes Historical Provider, MD  clonazePAM (KLONOPIN) 1 MG tablet Take 1 mg by mouth at bedtime as needed. For sleep   Yes Historical Provider, MD  ezetimibe (ZETIA) 10 MG tablet Take 10 mg by mouth daily.   Yes Historical Provider, MD  fenofibrate (TRICOR) 48 MG tablet Take 48 mg by mouth daily.   Yes Historical Provider, MD  fluticasone (FLONASE) 50 MCG/ACT nasal spray Place 2 sprays into the nose daily as needed. Allergies   Yes Historical Provider, MD  gabapentin (NEURONTIN) 300 MG capsule Take 300 mg by mouth. In the morning and afternoon, then take 900 mg at bedtime.   Yes Historical Provider, MD  guaiFENesin-codeine (ROBITUSSIN AC) 100-10 MG/5ML syrup Take 5 mLs by mouth every 4 (four) hours as needed. *May take every 4 to 6 hours as needed for cough*   Yes Historical Provider, MD  Menthol, Topical Analgesic, (BENGAY EX) Apply 1 application topically daily as needed. For pain   Yes Historical Provider, MD  Omega-3 Fatty  Acids (FISH OIL TRIPLE STRENGTH PO) Take 1 capsule by mouth 2 (two) times daily.   Yes Historical Provider, MD  tetrahydrozoline (VISINE) 0.05 % ophthalmic solution Place 1 drop into both eyes daily as needed. For pain   Yes Historical Provider, MD  diclofenac sodium (VOLTAREN) 1 % GEL Apply 2 g topically 4 (four) times daily. 06/15/16   Noemi Chapel, MD  HYDROcodone-acetaminophen (NORCO/VICODIN) 5-325 MG tablet Take 2 tablets by mouth every 4 (four) hours as needed. 06/15/16   Noemi Chapel, MD    Family History Family History  Problem Relation Age of Onset  . Heart failure Mother   . Heart disease Mother   . Cancer Father   . Heart disease Father   . Hyperlipidemia Sister   .  Hypertension Sister   . Heart disease Sister     before age 33  . Varicose Veins Sister   . Heart attack Sister   . AAA (abdominal aortic aneurysm) Sister   . Heart attack Sister     Social History Social History  Substance Use Topics  . Smoking status: Never Smoker  . Smokeless tobacco: Never Used  . Alcohol use Yes     Comment: occ     Allergies   Morphine   Review of Systems Review of Systems  All other systems reviewed and are negative.    Physical Exam Updated Vital Signs BP 137/66   Pulse 75   Temp 99.4 F (37.4 C) (Oral)   Resp 16   Ht 5\' 6"  (1.676 m)   Wt 140 lb (63.5 kg)   SpO2 98%   BMI 22.60 kg/m   Physical Exam  Constitutional: She appears well-developed and well-nourished. No distress.  HENT:  Head: Normocephalic and atraumatic.  Mouth/Throat: Oropharynx is clear and moist. No oropharyngeal exudate.  Eyes: Conjunctivae and EOM are normal. Pupils are equal, round, and reactive to light. Right eye exhibits no discharge. Left eye exhibits no discharge. No scleral icterus.  Neck: Normal range of motion. Neck supple. No JVD present. No thyromegaly present.  Cardiovascular: Normal rate, regular rhythm, normal heart sounds and intact distal pulses.  Exam reveals no gallop and no friction rub.   No murmur heard. Pulmonary/Chest: Effort normal and breath sounds normal. No respiratory distress. She has no wheezes. She has no rales.  Abdominal: Soft. Bowel sounds are normal. She exhibits no distension and no mass. There is no tenderness.  Musculoskeletal: Normal range of motion. She exhibits no edema or tenderness.  Compartments are diffusely soft, joints are supple  Lymphadenopathy:    She has no cervical adenopathy.  Neurological: She is alert. Coordination normal.  The patient has decreased range of motion of her bilateral upper extremities at the shoulder. This is rather symmetrical. She has normal grip strength. She has decreased range of motion of the  right leg secondary to pain at the right groin though she is able to straight leg raise bilaterally and has an intact extensor mechanism bilaterally. She has normal sensation to light touch on the bilateral lower extremities. She has normal muscle bulk  Skin: Skin is warm and dry. No rash noted. No erythema.  Psychiatric: She has a normal mood and affect. Her behavior is normal.  Nursing note and vitals reviewed.    ED Treatments / Results  Labs (all labs ordered are listed, but only abnormal results are displayed) Labs Reviewed  CBC WITH DIFFERENTIAL/PLATELET - Abnormal; Notable for the following:       Result Value  WBC 11.3 (*)    Platelets 718 (*)    Monocytes Absolute 1.5 (*)    All other components within normal limits  COMPREHENSIVE METABOLIC PANEL - Abnormal; Notable for the following:    Glucose, Bld 115 (*)    Albumin 3.4 (*)    AST 13 (*)    All other components within normal limits  URINALYSIS, ROUTINE W REFLEX MICROSCOPIC (NOT AT [facility]) - Abnormal; Notable for the following:    Specific Gravity, Urine <1.005 (*)    All other components within normal limits  CK - Abnormal; Notable for the following:    Total CK 19 (*)    All other components within normal limits    Radiology Ct Lumbar Spine Wo Contrast  Result Date: 06/15/2016 CLINICAL DATA:  65 year old female with a history of right shoulder and bilateral leg pain. EXAM: CT LUMBAR SPINE WITHOUT CONTRAST TECHNIQUE: Multidetector CT imaging of the lumbar spine was performed without intravenous contrast administration. Multiplanar CT image reconstructions were also generated. COMPARISON:  MRI 06/06/2012, plain film 02/22/2012 FINDINGS: Lumbar vertebral elements maintain relative normal alignment without subluxation. Coronal reformatted images demonstrate mild left apex scoliotic curvature of the lumbar spine which is centered at the degenerative changes at L3-L4. Trace retrolisthesis of L3 on L4 secondary to degenerative  changes. Facets maintain alignment no displaced pars defects. No acute fracture identified. Since the comparison MR and plain film, there has been progression of disc space narrowing endplate changes and anterior osteophyte production at the L3-L4 level. Vacuum disc phenomenon is present. The trace retrolisthesis at this level does not significantly in narrow the canal, which measures 1.4 cm at this level. Additional levels throughout the lumbar spine demonstrate early disc space narrowing without significant endplate changes. L1-L2: No significant canal narrowing or neural foraminal narrowing. L2-L3: No significant canal narrowing. Early ligamentum flavum hypertrophy. No significant foraminal narrowing. L3-L4: Loss of disc height and broad-based disc bulge contributes to bilateral foraminal narrowing, worst on the right. Tri-lateral narrowing of the canal secondary to facet hypertrophy and ligamentum flavum hypertrophy. L4-L5: Ligamentum flavum hypertrophy and broad-based disc bulge contribute to mild canal narrowing. L5-S1: Broad-based disc bulge, with focal left-sided protrusion in the foramen, similar to prior MRI. No evidence of canal hemorrhage. Calcifications of the abdominal aorta. IMPRESSION: No acute fracture or malalignment of the lumbar spine. Compared to MR and plain film of 2013, there has been progression of disc disease which is most pronounced at the L3-L4 level. At this level there is vacuum disc phenomenon, endplate changes, trace retrolisthesis, however, no significant canal narrowing. Facet changes and disc changes do result in right greater than left neural foraminal narrowing. Additional changes as above. Aortic atherosclerosis. Signed, Dulcy Fanny. Earleen Newport, DO [facility] Radiology Electronically Signed   By: Corrie Mckusick D.O.   On: 06/15/2016 08:56    Procedures Procedures (including critical care time)  Medications Ordered in  ED Medications  fentaNYL (SUBLIMAZE) injection 50 mcg (50 mcg Intravenous Given 06/15/16 0822)  sodium chloride 0.9 % bolus 1,000 mL (1,000 mLs Intravenous New Bag/Given 06/15/16 0821)     Initial Impression / Assessment and Plan / ED Course  I have reviewed the triage vital signs and the nursing notes.  Pertinent labs & imaging results that were available during my care of the patient were reviewed by me and considered in my medical decision making (see chart for details).  Clinical Course    The etiology of the patient's symptoms is unclear.  She clearly has muscular pain, I suspect much of this is deconditioning, her back pain has totally gone away with the epidural shot, this makes an epidural abscess less likely however given her subjective fever she will need further imaging, CT scan of the lower back is ordered as MRI is not available at this hospital today. Labs will be ordered, IV fluids and pain medication will be given.  I reviewed all of the patient's tests with her and with her family members at her agreeance, there does appear to be a thrombocytosis, there does appear to be a minimal leukocytosis, urinalysis is normal, CK is normal, CT scan shows multiple chronic degenerative abnormalities but no obvious signs of infection. Again the patient does not complain of back pain but more right groin pain. This is very reproducible on my exam with palpation and having her flex against my resistance. I think that she needs further rehabilitation care, she will be seeing her doctor this week, she will be given medications for pain as well. I offered her a wheelchair and a bedside commode but she requests only a bedpan.  Final diagnoses:  Right leg pain  Thrombocytosis (HCC)    New Prescriptions New Prescriptions   DICLOFENAC SODIUM (VOLTAREN) 1 % GEL    Apply 2 g topically 4 (four) times daily.   HYDROCODONE-ACETAMINOPHEN (NORCO/VICODIN) 5-325 MG TABLET    Take 2 tablets by mouth every 4  (four) hours as needed.     Noemi Chapel, MD 06/15/16 1006

## 2016-06-15 NOTE — ED Triage Notes (Signed)
Pt reports right shoulder and bilaterally leg pain. States unable to ambulate. No loss of bowel or bladder function. Had epidural on Thursday and that helped back, but legs and shoulder are hurting and no relief with pain medication. Sates has been receiving therapy which has worsened pain.

## 2016-06-25 DIAGNOSIS — M79604 Pain in right leg: Secondary | ICD-10-CM | POA: Diagnosis not present

## 2016-06-25 DIAGNOSIS — R079 Chest pain, unspecified: Secondary | ICD-10-CM | POA: Diagnosis not present

## 2016-06-25 DIAGNOSIS — M4306 Spondylolysis, lumbar region: Secondary | ICD-10-CM | POA: Diagnosis not present

## 2016-06-25 DIAGNOSIS — R9431 Abnormal electrocardiogram [ECG] [EKG]: Secondary | ICD-10-CM | POA: Diagnosis not present

## 2016-06-25 DIAGNOSIS — M2550 Pain in unspecified joint: Secondary | ICD-10-CM | POA: Diagnosis not present

## 2016-06-25 DIAGNOSIS — R700 Elevated erythrocyte sedimentation rate: Secondary | ICD-10-CM | POA: Diagnosis not present

## 2016-06-25 DIAGNOSIS — R109 Unspecified abdominal pain: Secondary | ICD-10-CM | POA: Diagnosis not present

## 2016-06-25 DIAGNOSIS — R531 Weakness: Secondary | ICD-10-CM | POA: Diagnosis not present

## 2016-06-25 DIAGNOSIS — M549 Dorsalgia, unspecified: Secondary | ICD-10-CM | POA: Diagnosis not present

## 2016-06-25 DIAGNOSIS — R7982 Elevated C-reactive protein (CRP): Secondary | ICD-10-CM | POA: Diagnosis not present

## 2016-06-25 DIAGNOSIS — M791 Myalgia: Secondary | ICD-10-CM | POA: Diagnosis not present

## 2016-06-26 DIAGNOSIS — R197 Diarrhea, unspecified: Secondary | ICD-10-CM | POA: Diagnosis not present

## 2016-06-26 DIAGNOSIS — E782 Mixed hyperlipidemia: Secondary | ICD-10-CM | POA: Diagnosis not present

## 2016-06-26 DIAGNOSIS — M79606 Pain in leg, unspecified: Secondary | ICD-10-CM | POA: Diagnosis not present

## 2016-06-26 DIAGNOSIS — R531 Weakness: Secondary | ICD-10-CM | POA: Diagnosis not present

## 2016-06-26 DIAGNOSIS — R7301 Impaired fasting glucose: Secondary | ICD-10-CM | POA: Diagnosis not present

## 2016-06-29 DIAGNOSIS — M542 Cervicalgia: Secondary | ICD-10-CM | POA: Diagnosis not present

## 2016-06-29 DIAGNOSIS — M545 Low back pain: Secondary | ICD-10-CM | POA: Diagnosis not present

## 2016-06-29 DIAGNOSIS — M5416 Radiculopathy, lumbar region: Secondary | ICD-10-CM | POA: Diagnosis not present

## 2016-07-01 ENCOUNTER — Emergency Department (HOSPITAL_COMMUNITY)
Admission: EM | Admit: 2016-07-01 | Discharge: 2016-07-01 | Disposition: A | Discharge status: Home / Self Care | Payer: Medicare Other | Attending: Emergency Medicine | Admitting: Emergency Medicine

## 2016-07-01 ENCOUNTER — Encounter (HOSPITAL_COMMUNITY): Payer: Self-pay

## 2016-07-01 DIAGNOSIS — R7989 Other specified abnormal findings of blood chemistry: Secondary | ICD-10-CM | POA: Diagnosis not present

## 2016-07-01 DIAGNOSIS — M549 Dorsalgia, unspecified: Secondary | ICD-10-CM | POA: Insufficient documentation

## 2016-07-01 DIAGNOSIS — D75839 Thrombocytosis, unspecified: Secondary | ICD-10-CM

## 2016-07-01 DIAGNOSIS — D473 Essential (hemorrhagic) thrombocythemia: Secondary | ICD-10-CM | POA: Diagnosis not present

## 2016-07-01 DIAGNOSIS — M79605 Pain in left leg: Secondary | ICD-10-CM | POA: Diagnosis present

## 2016-07-01 DIAGNOSIS — R531 Weakness: Secondary | ICD-10-CM | POA: Diagnosis not present

## 2016-07-01 DIAGNOSIS — Z79899 Other long term (current) drug therapy: Secondary | ICD-10-CM | POA: Insufficient documentation

## 2016-07-01 DIAGNOSIS — E782 Mixed hyperlipidemia: Secondary | ICD-10-CM | POA: Diagnosis not present

## 2016-07-01 DIAGNOSIS — F411 Generalized anxiety disorder: Secondary | ICD-10-CM | POA: Diagnosis not present

## 2016-07-01 DIAGNOSIS — R7301 Impaired fasting glucose: Secondary | ICD-10-CM | POA: Diagnosis not present

## 2016-07-01 LAB — CBC WITH DIFFERENTIAL/PLATELET
BASOS ABS: 0 10*3/uL (ref 0.0–0.1)
Basophils Relative: 0 %
EOS PCT: 0 %
Eosinophils Absolute: 0 10*3/uL (ref 0.0–0.7)
HEMATOCRIT: 35.3 % — AB (ref 36.0–46.0)
Hemoglobin: 11.3 g/dL — ABNORMAL LOW (ref 12.0–15.0)
Lymphocytes Relative: 14 %
Lymphs Abs: 1.4 10*3/uL (ref 0.7–4.0)
MCH: 29 pg (ref 26.0–34.0)
MCHC: 32 g/dL (ref 30.0–36.0)
MCV: 90.5 fL (ref 78.0–100.0)
MONO ABS: 0.3 10*3/uL (ref 0.1–1.0)
MONOS PCT: 3 %
Neutro Abs: 7.9 10*3/uL — ABNORMAL HIGH (ref 1.7–7.7)
Neutrophils Relative %: 83 %
PLATELETS: 1048 10*3/uL — AB (ref 150–400)
RBC: 3.9 MIL/uL (ref 3.87–5.11)
RDW: 12.1 % (ref 11.5–15.5)
WBC: 9.6 10*3/uL (ref 4.0–10.5)

## 2016-07-01 LAB — COMPREHENSIVE METABOLIC PANEL
ALT: 42 U/L (ref 14–54)
ANION GAP: 4 — AB (ref 5–15)
AST: 24 U/L (ref 15–41)
Albumin: 3.3 g/dL — ABNORMAL LOW (ref 3.5–5.0)
Alkaline Phosphatase: 80 U/L (ref 38–126)
BILIRUBIN TOTAL: 0.5 mg/dL (ref 0.3–1.2)
BUN: 12 mg/dL (ref 6–20)
CHLORIDE: 106 mmol/L (ref 101–111)
CO2: 26 mmol/L (ref 22–32)
Calcium: 8.2 mg/dL — ABNORMAL LOW (ref 8.9–10.3)
Creatinine, Ser: 1.02 mg/dL — ABNORMAL HIGH (ref 0.44–1.00)
GFR calc Af Amer: >60 mL/min (ref >60)
GFR calc non Af Amer: 56 mL/min — ABNORMAL LOW (ref >60)
Glucose, Bld: 249 mg/dL — ABNORMAL HIGH (ref 65–99)
POTASSIUM: 3.5 mmol/L (ref 3.5–5.1)
Sodium: 136 mmol/L (ref 135–145)
TOTAL PROTEIN: 7 g/dL (ref 6.5–8.1)

## 2016-07-01 LAB — IRON AND TIBC
IRON: 27 ug/dL — AB (ref 28–170)
SATURATION RATIOS: 9 % — AB (ref 10.4–31.8)
TIBC: 308 ug/dL (ref 250–450)
UIBC: 281 ug/dL

## 2016-07-01 LAB — VITAMIN B12: VITAMIN B 12: 276 pg/mL (ref 180–914)

## 2016-07-01 LAB — RETICULOCYTES
RBC.: 3.96 MIL/uL (ref 3.87–5.11)
RETIC COUNT ABSOLUTE: 99 10*3/uL (ref 19.0–186.0)
Retic Ct Pct: 2.5 % (ref 0.4–3.1)

## 2016-07-01 LAB — FOLATE: Folate: 14.4 ng/mL (ref >5.9)

## 2016-07-01 LAB — FERRITIN: Ferritin: 103 ng/mL (ref 11–307)

## 2016-07-01 MED ORDER — ASPIRIN EC 81 MG PO TBEC: 81.0000 mg | DELAYED_RELEASE_TABLET | Freq: Every day | ORAL | 3 refills | Status: DC

## 2016-07-01 NOTE — Discharge Instructions (Signed)
Your bloodwork was noted for thrombocytosis or elevated platelets. Please follow up with hematology this Friday, your appointment is with Dr. Whitney Muse at  3:20 pm. I want you start taking 81 mg of aspirin daily moving forwards, you can discuss with Dr. Whitney Muse moving forward. If you develop symptoms of swelling in your legs, if you have weakness/ numbness that is worsening, if you have sudden speech changes, or any changes in your mentation please return to the ED for follow-up. If you have  onset of severe abdominal pain, please return to the ED

## 2016-07-01 NOTE — ED Notes (Signed)
Platelets 1048. EDP aware.

## 2016-07-01 NOTE — ED Provider Notes (Signed)
[facility] DEPT Provider Note   CSN: [id number] Arrival date & time: 07/01/16  1326     History   Chief Complaint Chief Complaint  Patient presents with  . Pain    HPI Colleen Nash is a 65 y.o. female with pmhx of anxiety and back pan presenting from PCP for abnormal labs. Patient states that she has had a month of weakness and pain in her legs bilaterally. She has received MRIs of both her hip and back for this issue. She has been seen by orthopedic and has also received physical therapy for this problem. Patient was recently seen at [facility] ER and was discharged with no specific diagnosis for her pain. Patient states that this all started about 1 month ago. She had been having pain in her legs and she tried to get up and both of her legs were weak. She has been having this pain and weakness in her legs intermittently for the past month. Currently she denies having a weakness in her legs. However she does indicate she currently has bilateral pain which is very mild at the moment. She was sent over from her PCP for an elevated platelet count. No bowel or bladder incontinence. No saddle anesthesia.   HPI  Past Medical History:  Diagnosis Date  . Anxiety   . Back pain   . Hypercholesteremia   . Panic attacks   . Restless leg   . Urinary tract infection     Patient Active Problem List   Diagnosis Date Noted  . Abdominal aneurysm without mention of rupture 03/01/2014  . DYSPEPSIA 06/05/2009    Past Surgical History:  Procedure Laterality Date  . CESAREAN SECTION    . CHOLECYSTECTOMY    . TUBAL LIGATION      OB History    No data available       Home Medications    Prior to Admission medications   Medication Sig Start Date End Date Taking? Authorizing Provider  aspirin-acetaminophen-caffeine (EXCEDRIN MIGRAINE) [phone] MG per tablet Take 1-2 tablets by mouth every 6 (six) hours as needed. migraines    Historical Provider, MD  clonazePAM (KLONOPIN) 1 MG tablet  Take 1 mg by mouth at bedtime as needed. For sleep    Historical Provider, MD  diclofenac sodium (VOLTAREN) 1 % GEL Apply 2 g topically 4 (four) times daily. 06/15/16   Noemi Chapel, MD  ezetimibe (ZETIA) 10 MG tablet Take 10 mg by mouth daily.    Historical Provider, MD  fenofibrate (TRICOR) 48 MG tablet Take 48 mg by mouth daily.    Historical Provider, MD  fluticasone (FLONASE) 50 MCG/ACT nasal spray Place 2 sprays into the nose daily as needed. Allergies    Historical Provider, MD  gabapentin (NEURONTIN) 300 MG capsule Take 300 mg by mouth. In the morning and afternoon, then take 900 mg at bedtime.    Historical Provider, MD  guaiFENesin-codeine (ROBITUSSIN AC) 100-10 MG/5ML syrup Take 5 mLs by mouth every 4 (four) hours as needed. *May take every 4 to 6 hours as needed for cough*    Historical Provider, MD  HYDROcodone-acetaminophen (NORCO/VICODIN) 5-325 MG tablet Take 2 tablets by mouth every 4 (four) hours as needed. 06/15/16   Noemi Chapel, MD  Menthol, Topical Analgesic, (BENGAY EX) Apply 1 application topically daily as needed. For pain    Historical Provider, MD  Omega-3 Fatty Acids (FISH OIL TRIPLE STRENGTH PO) Take 1 capsule by mouth 2 (two) times daily.    Historical Provider, MD  tetrahydrozoline (VISINE) 0.05 % ophthalmic solution Place 1 drop into both eyes daily as needed. For pain    Historical Provider, MD    Family History Family History  Problem Relation Age of Onset  . Heart failure Mother   . Heart disease Mother   . Cancer Father   . Heart disease Father   . Hyperlipidemia Sister   . Hypertension Sister   . Heart disease Sister     before age 6  . Varicose Veins Sister   . Heart attack Sister   . AAA (abdominal aortic aneurysm) Sister   . Heart attack Sister     Social History Social History  Substance Use Topics  . Smoking status: Never Smoker  . Smokeless tobacco: Never Used  . Alcohol use Yes     Comment: occ     Allergies   Morphine   Review of  Systems Review of Systems  Constitutional: Negative for chills, fever and unexpected weight change.  HENT: Negative for congestion and sore throat.   Eyes: Negative for discharge and itching.  Respiratory: Negative for cough, shortness of breath and wheezing.   Cardiovascular: Negative for chest pain and leg swelling.  Gastrointestinal: Negative for abdominal pain, nausea and vomiting.  Genitourinary: Negative for dysuria and flank pain.  Musculoskeletal: Positive for back pain and gait problem. Negative for neck pain.  Skin: Negative for color change and rash.  Neurological: Positive for weakness. Negative for dizziness, numbness and headaches.     Physical Exam Updated Vital Signs BP 163/83 (BP Location: Right Arm)   Pulse 89   Temp 98 F (36.7 C)   Resp 18   SpO2 97%   Physical Exam  Constitutional: She is oriented to person, place, and time. She appears well-developed and well-nourished.  HENT:  Head: Normocephalic and atraumatic.  Right Ear: External ear normal.  Left Ear: External ear normal.  Nose: Nose normal.  Mouth/Throat: Oropharynx is clear and moist.  Eyes: Conjunctivae and EOM are normal. Pupils are equal, round, and reactive to light.  Neck: Normal range of motion. Neck supple.  Cardiovascular: Normal rate, regular rhythm, normal heart sounds and intact distal pulses.   Pulmonary/Chest: Effort normal and breath sounds normal.  Abdominal: Soft. Bowel sounds are normal.  Musculoskeletal: Normal range of motion. She exhibits no edema.  Neurological: She is alert and oriented to person, place, and time. No cranial nerve deficit. She exhibits normal muscle tone. Coordination normal.  Patient was able to ambulate without issue.   Skin: Skin is warm and dry.     ED Treatments / Results  Labs (all labs ordered are listed, but only abnormal results are displayed) Labs Reviewed  CBC WITH DIFFERENTIAL/PLATELET - Abnormal; Notable for the following:       Result  Value   Hemoglobin 11.3 (*)    HCT 35.3 (*)    Platelets 1,048 (*)    Neutro Abs 7.9 (*)    All other components within normal limits  COMPREHENSIVE METABOLIC PANEL - Abnormal; Notable for the following:    Glucose, Bld 249 (*)    Creatinine, Ser 1.02 (*)    Calcium 8.2 (*)    Albumin 3.3 (*)    GFR calc non Af Amer 56 (*)    Anion gap 4 (*)    All other components within normal limits  PATHOLOGIST SMEAR REVIEW    EKG  EKG Interpretation None       Radiology No results found.  Procedures Procedures (including  critical care time)  Medications Ordered in ED Medications - No data to display   Initial Impression / Assessment and Plan / ED Course  I have reviewed the triage vital signs and the nursing notes.  Pertinent labs & imaging results that were available during my care of the patient were reviewed by me and considered in my medical decision making (see chart for details).   Patient presenting for elevated platelets and hx of intermittent bilateral pain and weakness in legs of 1 months duration. PCP concern for possible stroke considering patient with hx of bilateral weakness. The patient neurologically intact on exam, able to ambulate normally, with no weakness in her legs currently though she does endorse mild pain. Patient without swelling or erythema in either of her legs, negative homans'.   Discussed case with hematology and they recommended patient follow-up on Friday, appointment made and provide at discharge.    Final Clinical Impressions(s) / ED Diagnoses   Final diagnoses:  None    New Prescriptions New Prescriptions   No medications on file     Barrett Holthaus Cletis Media, MD 07/01/16 2101    Harvel Meskill Cletis Media, MD 07/01/16 2102    Merrily Pew, MD 07/01/16 2150

## 2016-07-01 NOTE — ED Triage Notes (Signed)
Pt here for pain and abnormal labs. Pt is being seen by [facility] at present moment for unknown cause extremity weakness and pain. Pt also seeing Dr. [place] Crane office sent over labs with Platelet Count of 848 and told pt to come to ER. Marland Kitchen Pt tearful in triage.

## 2016-07-02 LAB — PATHOLOGIST SMEAR REVIEW

## 2016-07-03 ENCOUNTER — Encounter (HOSPITAL_COMMUNITY): Payer: Medicare Other | Attending: Hematology & Oncology | Admitting: Hematology & Oncology

## 2016-07-03 VITALS — BP 141/85 | HR 94 | Temp 98.2°F | Resp 18 | Wt 147.2 lb

## 2016-07-03 DIAGNOSIS — D72829 Elevated white blood cell count, unspecified: Secondary | ICD-10-CM | POA: Diagnosis not present

## 2016-07-03 DIAGNOSIS — Z7982 Long term (current) use of aspirin: Secondary | ICD-10-CM | POA: Insufficient documentation

## 2016-07-03 DIAGNOSIS — Z79899 Other long term (current) drug therapy: Secondary | ICD-10-CM | POA: Diagnosis not present

## 2016-07-03 DIAGNOSIS — D473 Essential (hemorrhagic) thrombocythemia: Secondary | ICD-10-CM | POA: Diagnosis not present

## 2016-07-03 DIAGNOSIS — Z885 Allergy status to narcotic agent status: Secondary | ICD-10-CM | POA: Insufficient documentation

## 2016-07-03 DIAGNOSIS — D649 Anemia, unspecified: Secondary | ICD-10-CM

## 2016-07-03 DIAGNOSIS — D509 Iron deficiency anemia, unspecified: Secondary | ICD-10-CM

## 2016-07-03 DIAGNOSIS — E538 Deficiency of other specified B group vitamins: Secondary | ICD-10-CM | POA: Diagnosis present

## 2016-07-03 DIAGNOSIS — E611 Iron deficiency: Secondary | ICD-10-CM | POA: Diagnosis not present

## 2016-07-03 DIAGNOSIS — M79606 Pain in leg, unspecified: Secondary | ICD-10-CM | POA: Diagnosis not present

## 2016-07-03 DIAGNOSIS — D510 Vitamin B12 deficiency anemia due to intrinsic factor deficiency: Secondary | ICD-10-CM

## 2016-07-03 DIAGNOSIS — E7800 Pure hypercholesterolemia, unspecified: Secondary | ICD-10-CM | POA: Diagnosis not present

## 2016-07-03 DIAGNOSIS — D7589 Other specified diseases of blood and blood-forming organs: Secondary | ICD-10-CM | POA: Diagnosis not present

## 2016-07-03 DIAGNOSIS — R531 Weakness: Secondary | ICD-10-CM | POA: Diagnosis not present

## 2016-07-03 DIAGNOSIS — D75839 Thrombocytosis, unspecified: Secondary | ICD-10-CM

## 2016-07-03 LAB — SEDIMENTATION RATE: Sed Rate: 28 mm/hr — ABNORMAL HIGH (ref 0–22)

## 2016-07-03 LAB — C-REACTIVE PROTEIN: CRP: 2.5 mg/dL — AB (ref <1.0)

## 2016-07-03 MED ORDER — POLYSACCHARIDE IRON COMPLEX 150 MG PO CAPS: 150.0000 mg | ORAL_CAPSULE | Freq: Every day | ORAL | 3 refills | Status: DC

## 2016-07-03 MED ORDER — CYANOCOBALAMIN 1000 MCG/ML IJ SOLN
1000.0000 ug | Freq: Once | INTRAMUSCULAR | Status: AC
  Administered 2016-07-03: 1000 ug via INTRAMUSCULAR

## 2016-07-03 MED ORDER — CYANOCOBALAMIN 1000 MCG/ML IJ SOLN
INTRAMUSCULAR | Status: AC
  Filled 2016-07-03: qty 1

## 2016-07-03 NOTE — Patient Instructions (Signed)
[place] at [facility] Discharge Instructions  RECOMMENDATIONS MADE BY THE CONSULTANT AND ANY TEST RESULTS WILL BE SENT TO YOUR REFERRING PHYSICIAN.  You were seen today by Dr. Whitney Muse.  You had lab work done today. Return in 2 weeks for follow up.   Thank you for choosing [place] at [facility] to provide your oncology and hematology care.  To afford each patient quality time with our provider, please arrive at least 15 minutes before your scheduled appointment time.   Beginning January 23rd 2017 lab work for the [facility] will be done in the  Main lab at [facility] on 1st floor. If you have a lab appointment with the [facility] please come in thru the  Main Entrance and check in at the main information desk  You need to re-schedule your appointment should you arrive 10 or more minutes late.  We strive to give you quality time with our providers, and arriving late affects you and other patients whose appointments are after yours.  Also, if you no show three or more times for appointments you may be dismissed from the clinic at the providers discretion.     Again, thank you for choosing [facility].  Our hope is that these requests will decrease the amount of time that you wait before being seen by our physicians.       _____________________________________________________________  Should you have questions after your visit to [facility], please contact our office at [phone] between the hours of 8:30 a.m. and 4:30 p.m.  Voicemails left after 4:30 p.m. will not be returned until the following business day.  For prescription refill requests, have your pharmacy contact our office.         Resources For Cancer Patients and their Caregivers ? American Cancer Society: Can assist with transportation, wigs, general needs, runs Look Good Feel Better.        908-732-9025 ? Cancer Care: Provides  financial assistance, online support groups, medication/co-pay assistance.  1-800-813-HOPE (602)203-0038) ? [facility] Assists [place] cancer patients and their families through emotional , educational and financial support.  662-466-9559 ? [place] DSS Where to apply for food stamps, Medicaid and utility assistance. 315-772-7225 ? [facility]: Transportation to medical appointments. (629)857-7609 ? Social Security Administration: May apply for disability if have a Stage IV cancer. 646-144-0518 250 544 9513 ? [employer], Disability and Transit Services: Assists with nutrition, care and transit needs. [facility] Support Programs: @10RELATIVEDAYS @ > Cancer Support Group  2nd Tuesday of the month 1pm-2pm, Journey Room  > Creative Journey  3rd Tuesday of the month 1130am-1pm, Journey Room  > Look Good Feel Better  1st Wednesday of the month 10am-12 noon, Journey Room (Call [place] to register [phone])

## 2016-07-03 NOTE — Progress Notes (Signed)
Pt given B12 injection in left deltoid per verbal order. Pt tolerated well.

## 2016-07-03 NOTE — Progress Notes (Signed)
[place]  CONSULT NOTE  Patient Care Team: Celene Squibb, MD as PCP - General (Internal Medicine)  CHIEF COMPLAINTS/PURPOSE OF CONSULTATION:  Thrombocytosis Weakness of legs, leg pain  HISTORY OF PRESENTING ILLNESS:  Colleen Nash 65 y.o. female is here because of thrombocytosis. Platelet count dating back several years has been running in the low 500K range, on 06/15/2016 platelet count was 718K, wbc count was 11.8. On 07/01/2016 platelet count was 1,048 with normal WBC count, neutrophilia was noted. She denies headaches or blurry vision, no bleeding or bruising. Mild normocytic anemia is also noted.  Ms. Luiz is unaccompanied. She was tearful most of our visit. I personally reviewed and went over recent laboratory studies with the patient.  Upon entering the room, the patient states "I just hurt all over". [place] She went to physical therapy. The first week of therapy was good. The second week the lady pulled her right leg a certain way which caused her more pain. States her pain has gotten worse from then. Steroids help with her pain.  Her legs really started bothering her about 1 month ago. States her kids have to help her out of the bed and out of a chair. Before 1 month ago she felt fine and has never really been sick. She localizes the pain to her bilateral inguinal/hip region. Recently she fell in the bathroom and could not get up. She could not feel her legs at that time, "They just dangled". She no longer hurts in her back or hip, after receiving an epidural. She [facility] to [facility] her back pain. CT of the lumbar spine was performed on 06/15/2016 which shoed disc disease, most pronounced at the L3-L4 level, but no significant canal narrowing. The patient is requesting to have her rheumatoid arthritis tested today. She also asked about being checked for Addison's disease.  She has not had a colonoscopy in about 10  years. Her last colonoscopy was with Dr. Arnoldo Morale. She has had some recent problems with diarrhea. She denies blood in stool. She does report having a hemorrhoid. She denies eating ice.  The patient is requesting to have her rheumatoid arthritis tested today. She also asked about being checked for Addison's disease.  She has not had a mammogram this year. States all prior mammograms have come back normal.  States, "I want to go home so bad", in [place].  She is requesting a copy of the tests being performed today so she can review these with her sister.  The patient is here for further evaluation and discussion of abnormal blood counts.  MEDICAL HISTORY:  Past Medical History:  Diagnosis Date  . Anxiety   . Back pain   . Hypercholesteremia   . Panic attacks   . Restless leg   . Urinary tract infection     SURGICAL HISTORY: Past Surgical History:  Procedure Laterality Date  . CESAREAN SECTION    . CHOLECYSTECTOMY    . TUBAL LIGATION      SOCIAL HISTORY: Social History   Social History  . Marital status: Widowed    Spouse name: N/A  . Number of children: N/A  . Years of education: N/A   Occupational History  . Not on file.   Social History Main Topics  . Smoking status: Never Smoker  . Smokeless tobacco: Never Used  . Alcohol use Yes  Comment: occ  . Drug use: No  . Sexual activity: Yes    Birth control/ protection: Surgical   Other Topics Concern  . Not on file   Social History Narrative  . No narrative on file   Widowed 4 years ago, he committed suicide on Christmas. He was in poor health.  2 children 5 grandchildren Non smoker ETOH, a glass when going out for dinner maybe Lives by herself She enjoys traveling  FAMILY HISTORY: Family History  Problem Relation Age of Onset  . Heart failure Mother   . Heart disease Mother   . Cancer Father   . Heart disease Father   . Hyperlipidemia Sister   . Hypertension Sister    . Heart disease Sister     before age 48  . Varicose Veins Sister   . Heart attack Sister   . AAA (abdominal aortic aneurysm) Sister   . Heart attack Sister    Mother deceased at 25 yo of congestive heart failure. Smoker Father deceased at 40 yo of lung cancer. Smoker Grandmother died of Vitamin B deficiency 6 sisters.  1 sister died of infection after a hip replacement 1 brother died in [place]  ALLERGIES:  is allergic to morphine.  MEDICATIONS:  Current Outpatient Prescriptions  Medication Sig Dispense Refill  . aspirin EC 81 MG tablet Take 1 tablet (81 mg total) by mouth daily. 30 tablet 3  . aspirin-acetaminophen-caffeine (EXCEDRIN MIGRAINE) [id number] MG per tablet Take 1-2 tablets by mouth every 6 (six) hours as needed. migraines    . clonazePAM (KLONOPIN) 1 MG tablet Take 1 mg by mouth at bedtime. For sleep    . diclofenac sodium (VOLTAREN) 1 % GEL Apply 2 g topically 4 (four) times daily. 100 g 0  . ezetimibe (ZETIA) 10 MG tablet Take 10 mg by mouth at bedtime.     . fenofibrate (TRICOR) 48 MG tablet Take 48 mg by mouth at bedtime.     . fluticasone (FLONASE) 50 MCG/ACT nasal spray Place 2 sprays into the nose daily as needed. Allergies    . gabapentin (NEURONTIN) 300 MG capsule Take 600-900 mg by mouth 3 (three) times daily. 600 mg in the morning and afternoon, then 900 mg at bedtime.    Marland Kitchen HYDROcodone-acetaminophen (NORCO/VICODIN) 5-325 MG tablet Take 2 tablets by mouth every 4 (four) hours as needed. (Patient taking differently: Take 2 tablets by mouth every 4 (four) hours as needed for moderate pain or severe pain. ) 20 tablet 0   No current facility-administered medications for this visit.     Review of Systems  HENT: Negative.   Eyes: Negative.   Respiratory: Negative.   Cardiovascular: Negative.   Gastrointestinal: Negative.   Genitourinary: Negative.   Musculoskeletal: Positive for falls, joint pain and myalgias.       Leg pain began about 1 month ago  Skin:  Negative.   Neurological: Positive for weakness.  Endo/Heme/Allergies: Negative.   Psychiatric/Behavioral: Positive for depression.       Flat affect  All other systems reviewed and are negative. 14 point ROS was done and is otherwise as detailed above or in HPI   PHYSICAL EXAMINATION: ECOG PERFORMANCE STATUS: 2 - Symptomatic, <50% confined to bed  Vitals:   07/03/16 1520  BP: (!) 141/85  Pulse: 94  Resp: 18  Temp: 98.2 F (36.8 C)   Filed Weights   07/03/16 1520  Weight: 147 lb 3.2 oz (66.8 kg)    Physical Exam  Constitutional: She is  oriented to person, place, and time. She appears distressed.  Thin, tearful  HENT:  Head: Normocephalic and atraumatic.  Nose: Nose normal.  Mouth/Throat: Oropharynx is clear and moist. No oropharyngeal exudate.  fissures in the corners of mouth  Eyes: Conjunctivae and EOM are normal. Pupils are equal, round, and reactive to light. Right eye exhibits no discharge. Left eye exhibits no discharge. No scleral icterus.  Neck: Normal range of motion. Neck supple. No tracheal deviation present. No thyromegaly present.  Cardiovascular: Normal rate, regular rhythm and normal heart sounds.  Exam reveals no gallop and no friction rub.   No murmur heard. Pulmonary/Chest: Effort normal and breath sounds normal. She has no wheezes. She has no rales.  Abdominal: Soft. Bowel sounds are normal. She exhibits no distension and no mass. There is no tenderness. There is no rebound and no guarding.  Musculoskeletal: Normal range of motion. She exhibits no edema.  Lymphadenopathy:    She has no cervical adenopathy.  Neurological: She is alert and oriented to person, place, and time. She has normal reflexes. No cranial nerve deficit. Gait normal. Coordination normal.  Skin: Skin is warm and dry. No rash noted.  Psychiatric: Mood, memory, affect and judgment normal.  Nursing note and vitals reviewed.   LABORATORY DATA:  I have reviewed the data as listed Lab  Results  Component Value Date   WBC 9.6 07/01/2016   HGB 11.3 (L) 07/01/2016   HCT 35.3 (L) 07/01/2016   MCV 90.5 07/01/2016   PLT 1,048 (HH) 07/01/2016   CMP     Component Value Date/Time   NA 136 07/01/2016 1435   K 3.5 07/01/2016 1435   CL 106 07/01/2016 1435   CO2 26 07/01/2016 1435   GLUCOSE 249 (H) 07/01/2016 1435   BUN 12 07/01/2016 1435   CREATININE 1.02 (H) 07/01/2016 1435   CALCIUM 8.2 (L) 07/01/2016 1435   PROT 7.0 07/01/2016 1435   ALBUMIN 3.3 (L) 07/01/2016 1435   AST 24 07/01/2016 1435   ALT 42 07/01/2016 1435   ALKPHOS 80 07/01/2016 1435   BILITOT 0.5 07/01/2016 1435   GFRNONAA 56 (L) 07/01/2016 1435   GFRAA >60 07/01/2016 1435   Results for AVENLEY, RAJPUT (MRN [id number])   Ref. Range 10/10/2008 19:53 03/19/2009 15:15 04/26/2009 13:59 07/15/2012 20:19 06/15/2016 08:20 07/01/2016 14:35  Platelets Latest Ref Range: 150 - 400 K/uL 511 (H) 538 (H) 500 (H) 408 (H) 718 (H) 1,048 (HH)    RADIOGRAPHIC STUDIES: I have personally reviewed the radiological images as listed and agreed with the findings in the report. No results found. Study Result   CLINICAL DATA:  65 year old female with a history of right shoulder and bilateral leg pain.  EXAM: CT LUMBAR SPINE WITHOUT CONTRAST  TECHNIQUE: Multidetector CT imaging of the lumbar spine was performed without intravenous contrast administration. Multiplanar CT image reconstructions were also generated.  COMPARISON:  MRI 06/06/2012, plain film 02/22/2012  FINDINGS: Lumbar vertebral elements maintain relative normal alignment without subluxation. Coronal reformatted images demonstrate mild left apex scoliotic curvature of the lumbar spine which is centered at the degenerative changes at L3-L4.  Trace retrolisthesis of L3 on L4 secondary to degenerative changes.  Facets maintain alignment no displaced pars defects.  No acute fracture identified.  Since the comparison MR and plain film, there has been  progression of disc space narrowing endplate changes and anterior osteophyte production at the L3-L4 level. Vacuum disc phenomenon is present. The trace retrolisthesis at this level does not significantly in narrow  the canal, which measures 1.4 cm at this level.  Additional levels throughout the lumbar spine demonstrate early disc space narrowing without significant endplate changes.  L1-L2: No significant canal narrowing or neural foraminal narrowing.  L2-L3: No significant canal narrowing. Early ligamentum flavum hypertrophy. No significant foraminal narrowing.  L3-L4: Loss of disc height and broad-based disc bulge contributes to bilateral foraminal narrowing, worst on the right. Tri-lateral narrowing of the canal secondary to facet hypertrophy and ligamentum flavum hypertrophy.  L4-L5: Ligamentum flavum hypertrophy and broad-based disc bulge contribute to mild canal narrowing.  L5-S1: Broad-based disc bulge, with focal left-sided protrusion in the foramen, similar to prior MRI.  No evidence of canal hemorrhage.  Calcifications of the abdominal aorta.  IMPRESSION: No acute fracture or malalignment of the lumbar spine.  Compared to MR and plain film of 2013, there has been progression of disc disease which is most pronounced at the L3-L4 level. At this level there is vacuum disc phenomenon, endplate changes, trace retrolisthesis, however, no significant canal narrowing. Facet changes and disc changes do result in right greater than left neural foraminal narrowing.  Additional changes as above.  Aortic atherosclerosis.  Signed,  Dulcy Fanny. Earleen Newport, DO  [facility]  [facility]   Electronically Signed   By: Corrie Mckusick D.O.   On: 06/15/2016 08:56   PATHOLOGY     ASSESSMENT & PLAN:  Leukocytosis Thrombocytosis Leg weakness/Leg pain Mild normocytic anemia Iron deficiency  I discussed  causes of thrombocytosis with the patient. The initial diagnostic question is whether thrombocytosis is a reactive phenomenon or a marker for the presence of a hematologic disorder  Causes of reactive thrombocytosis include iron deficiency, B12 deficiency, malignancy, rheumatologic disorders, chronic infection, allergies, reaction to medications. Etc.  If a reactive process has been ruled out, the next step is to accurately classify the thrombocytosis as being due to one of the defined myeloproliferative or myelodysplastic disorders. In general, autonomous thrombocytosis (AT) is a reasonable diagnostic possibility in a patient with chronic thrombocytosis, normal iron stores, and an intact spleen.  I advised her that it is recommended that in all patients in whom a reactive process cannot be identified a bone marrow examination with reticulin staining and cytogenetic studies would be recommended  There are no laboratory findings which are pathognomonic for ET. Thus, ET is the working diagnosis when autonomous or clonal thrombocytosis occurs in the absence of the diagnostic features noted above  Diagnostic criteria for ET proposed by the [facility]  requires a sustained platelet count of over 450,000/microL . The rate of cytogenetic abnormalities is less than 5 percent, although the JAK2 V617F mutation is seen in approximately 50 percent of patients with ET.  Will do genetic testing: JAK2, MPL, CALR Have repeated iron studies Have ordered intrinsic factor antibodies given her low B12. She was given a B12 injection 1000 mcg IM today. She was instructed to continue on baby aspirin daily.  Her mild anemia could certainly be explained by her low B12 and iron deficiency.   Orders Placed This Encounter  Procedures  . Sedimentation rate  . IgG, IgA, IgM  . Rheumatoid factor  . JAK2 V617F, Rfx CALR/E12/MPL  . Copper, serum  . C-reactive protein  . Intrinsic Factor Antibodies    She will return for follow  up the end of next week or the following week to review the results of blood work drawn today.  All questions were answered. The patient knows to call the clinic with  any problems, questions or concerns.  This document serves as a record of services personally performed by Ancil Linsey, MD. It was created on her behalf by Arlyce Harman, a trained medical scribe. The creation of this record is based on the scribe's personal observations and the provider's statements to them. This document has been checked and approved by the attending provider.  I have reviewed the above documentation for accuracy and completeness and I agree with the above.  This note was electronically signed.  Molli Hazard, MD  07/03/2016 3:23 PM

## 2016-07-04 LAB — RHEUMATOID FACTOR: Rhuematoid fact SerPl-aCnc: <10 IU/mL (ref 0.0–13.9)

## 2016-07-04 LAB — IGG, IGA, IGM
IGA: 374 mg/dL — AB (ref 87–352)
IgG (Immunoglobin G), Serum: 747 mg/dL (ref 700–1600)
IgM, Serum: 122 mg/dL (ref 26–217)

## 2016-07-04 LAB — COPPER, SERUM: COPPER: 181 ug/dL — AB (ref 72–166)

## 2016-07-06 DIAGNOSIS — M542 Cervicalgia: Secondary | ICD-10-CM | POA: Diagnosis not present

## 2016-07-06 DIAGNOSIS — M25511 Pain in right shoulder: Secondary | ICD-10-CM | POA: Diagnosis not present

## 2016-07-06 DIAGNOSIS — R200 Anesthesia of skin: Secondary | ICD-10-CM | POA: Diagnosis not present

## 2016-07-06 DIAGNOSIS — M25512 Pain in left shoulder: Secondary | ICD-10-CM | POA: Diagnosis not present

## 2016-07-06 LAB — INTRINSIC FACTOR ANTIBODIES: Intrinsic Factor: 14.6 AU/mL — ABNORMAL HIGH (ref 0.0–1.1)

## 2016-07-08 ENCOUNTER — Telehealth (HOSPITAL_COMMUNITY): Payer: Self-pay | Admitting: *Deleted

## 2016-07-08 ENCOUNTER — Other Ambulatory Visit (HOSPITAL_COMMUNITY): Payer: Self-pay | Admitting: Hematology & Oncology

## 2016-07-08 DIAGNOSIS — D510 Vitamin B12 deficiency anemia due to intrinsic factor deficiency: Secondary | ICD-10-CM | POA: Insufficient documentation

## 2016-07-08 HISTORY — DX: Vitamin B12 deficiency anemia due to intrinsic factor deficiency: D51.0

## 2016-07-08 NOTE — Telephone Encounter (Signed)
-----   Message from Patrici Ranks, MD sent at 07/08/2016 11:56 AM EDT ----- Bring in for 3 additional weekly B12 then she will need monthly. Supportive therapy plan built.  Dr.P

## 2016-07-08 NOTE — Telephone Encounter (Signed)
Pt aware that she will need 3 additional weekly B12 injections and then start monthly B12 injections. Pt verbalized understanding

## 2016-07-10 ENCOUNTER — Encounter (HOSPITAL_COMMUNITY): Payer: Self-pay | Admitting: Hematology & Oncology

## 2016-07-10 DIAGNOSIS — D75839 Thrombocytosis, unspecified: Secondary | ICD-10-CM | POA: Insufficient documentation

## 2016-07-10 DIAGNOSIS — D473 Essential (hemorrhagic) thrombocythemia: Secondary | ICD-10-CM | POA: Insufficient documentation

## 2016-07-10 HISTORY — DX: Thrombocytosis, unspecified: D75.839

## 2016-07-14 LAB — JAK2 V617F, W REFLEX TO CALR/E12/MPL

## 2016-07-15 ENCOUNTER — Ambulatory Visit (HOSPITAL_COMMUNITY): Payer: Medicare Other

## 2016-07-15 ENCOUNTER — Encounter (HOSPITAL_COMMUNITY): Payer: Medicare Other | Attending: Hematology & Oncology

## 2016-07-15 VITALS — BP 137/75 | HR 75 | Temp 98.0°F | Resp 16 | Wt 146.2 lb

## 2016-07-15 DIAGNOSIS — E7800 Pure hypercholesterolemia, unspecified: Secondary | ICD-10-CM | POA: Insufficient documentation

## 2016-07-15 DIAGNOSIS — D510 Vitamin B12 deficiency anemia due to intrinsic factor deficiency: Secondary | ICD-10-CM

## 2016-07-15 DIAGNOSIS — R531 Weakness: Secondary | ICD-10-CM | POA: Insufficient documentation

## 2016-07-15 DIAGNOSIS — Z885 Allergy status to narcotic agent status: Secondary | ICD-10-CM | POA: Insufficient documentation

## 2016-07-15 DIAGNOSIS — E538 Deficiency of other specified B group vitamins: Secondary | ICD-10-CM | POA: Diagnosis present

## 2016-07-15 DIAGNOSIS — D72829 Elevated white blood cell count, unspecified: Secondary | ICD-10-CM | POA: Insufficient documentation

## 2016-07-15 DIAGNOSIS — Z7982 Long term (current) use of aspirin: Secondary | ICD-10-CM | POA: Insufficient documentation

## 2016-07-15 DIAGNOSIS — D7589 Other specified diseases of blood and blood-forming organs: Secondary | ICD-10-CM | POA: Insufficient documentation

## 2016-07-15 DIAGNOSIS — Z79899 Other long term (current) drug therapy: Secondary | ICD-10-CM | POA: Insufficient documentation

## 2016-07-15 MED ORDER — CYANOCOBALAMIN 1000 MCG/ML IJ SOLN
INTRAMUSCULAR | Status: AC
  Filled 2016-07-15: qty 1

## 2016-07-15 MED ORDER — CYANOCOBALAMIN 1000 MCG/ML IJ SOLN
1000.0000 ug | Freq: Once | INTRAMUSCULAR | Status: AC
  Administered 2016-07-15: 1000 ug via INTRAMUSCULAR

## 2016-07-15 NOTE — Progress Notes (Signed)
Eather Colas Loja tolerated Vit B12 injection well without complaints or incident. Pt discharged via wheelchair in stable condition with son

## 2016-07-15 NOTE — Patient Instructions (Signed)
[facility] at [facility] Discharge Instructions  RECOMMENDATIONS MADE BY THE CONSULTANT AND ANY TEST RESULTS WILL BE SENT TO YOUR REFERRING PHYSICIAN.  Received Vit B12 injection today. Follow-up as scheduled. Call clinic for any questions or concerns  Thank you for choosing [facility] at [facility] to provide your oncology and hematology care.  To afford each patient quality time with our provider, please arrive at least 15 minutes before your scheduled appointment time.   Beginning January 23rd 2017 lab work for the Cancer Center will be done in the  Main lab at [facility] on 1st floor. If you have a lab appointment with the Cancer Center please come in thru the  Main Entrance and check in at the main information desk  You need to re-schedule your appointment should you arrive 10 or more minutes late.  We strive to give you quality time with our providers, and arriving late affects you and other patients whose appointments are after yours.  Also, if you no show three or more times for appointments you may be dismissed from the clinic at the providers discretion.     Again, thank you for choosing [facility].  Our hope is that these requests will decrease the amount of time that you wait before being seen by our physicians.       _____________________________________________________________  Should you have questions after your visit to  Cancer Center, please contact our office at [phone] between the hours of 8:30 a.m. and 4:30 p.m.  Voicemails left after 4:30 p.m. will not be returned until the following business day.  For prescription refill requests, have your pharmacy contact our office.         Resources For Cancer Patients and their Caregivers ? American Cancer Society: Can assist with transportation, wigs, general needs, runs Look Good Feel Better.        1-888-227-6333 ? Cancer Care: Provides  financial assistance, online support groups, medication/co-pay assistance.  1-800-813-HOPE (4673) ? [facility] Assists [place] cancer patients and their families through emotional , educational and financial support.  336-427-4357 ? [place] DSS Where to apply for food stamps, Medicaid and utility assistance. 336-342-1394 ? RCATS: Transportation to medical appointments. 336-347-2287 ? Social Security Administration: May apply for disability if have a Stage IV cancer. 336-342-7796 1-800-772-1213 ? [place] Aging, Disability and Transit Services: Assists with nutrition, care and transit needs. 336-349-2343  Cancer Center Support Programs: @10RELATIVEDAYS@ > Cancer Support Group  2nd Tuesday of the month 1pm-2pm, Journey Room  > Creative Journey  3rd Tuesday of the month 1130am-1pm, Journey Room  > Look Good Feel Better  1st Wednesday of the month 10am-12 noon, Journey Room (Call American Cancer Society to register 1-800-395-5775)   

## 2016-07-16 DIAGNOSIS — M7541 Impingement syndrome of right shoulder: Secondary | ICD-10-CM | POA: Diagnosis not present

## 2016-07-16 DIAGNOSIS — M7542 Impingement syndrome of left shoulder: Secondary | ICD-10-CM | POA: Diagnosis not present

## 2016-07-21 ENCOUNTER — Encounter (HOSPITAL_BASED_OUTPATIENT_CLINIC_OR_DEPARTMENT_OTHER): Payer: Medicare Other

## 2016-07-21 ENCOUNTER — Encounter (HOSPITAL_COMMUNITY): Payer: Medicare Other

## 2016-07-21 ENCOUNTER — Encounter (HOSPITAL_COMMUNITY): Payer: Self-pay | Admitting: Oncology

## 2016-07-21 ENCOUNTER — Encounter (HOSPITAL_BASED_OUTPATIENT_CLINIC_OR_DEPARTMENT_OTHER): Payer: Medicare Other | Admitting: Oncology

## 2016-07-21 VITALS — BP 135/90 | HR 99 | Temp 97.8°F | Resp 16 | Wt 142.0 lb

## 2016-07-21 DIAGNOSIS — Z79899 Other long term (current) drug therapy: Secondary | ICD-10-CM | POA: Diagnosis not present

## 2016-07-21 DIAGNOSIS — D510 Vitamin B12 deficiency anemia due to intrinsic factor deficiency: Secondary | ICD-10-CM

## 2016-07-21 DIAGNOSIS — Z7982 Long term (current) use of aspirin: Secondary | ICD-10-CM | POA: Diagnosis not present

## 2016-07-21 DIAGNOSIS — D75839 Thrombocytosis, unspecified: Secondary | ICD-10-CM

## 2016-07-21 DIAGNOSIS — N3000 Acute cystitis without hematuria: Secondary | ICD-10-CM

## 2016-07-21 DIAGNOSIS — D473 Essential (hemorrhagic) thrombocythemia: Secondary | ICD-10-CM | POA: Diagnosis not present

## 2016-07-21 DIAGNOSIS — Z885 Allergy status to narcotic agent status: Secondary | ICD-10-CM | POA: Diagnosis not present

## 2016-07-21 DIAGNOSIS — R531 Weakness: Secondary | ICD-10-CM | POA: Diagnosis not present

## 2016-07-21 DIAGNOSIS — E7800 Pure hypercholesterolemia, unspecified: Secondary | ICD-10-CM | POA: Diagnosis not present

## 2016-07-21 DIAGNOSIS — D7589 Other specified diseases of blood and blood-forming organs: Secondary | ICD-10-CM | POA: Diagnosis not present

## 2016-07-21 DIAGNOSIS — D72829 Elevated white blood cell count, unspecified: Secondary | ICD-10-CM | POA: Diagnosis not present

## 2016-07-21 DIAGNOSIS — R8290 Unspecified abnormal findings in urine: Secondary | ICD-10-CM | POA: Diagnosis not present

## 2016-07-21 LAB — CBC WITH DIFFERENTIAL/PLATELET
BASOS ABS: 0 10*3/uL (ref 0.0–0.1)
Basophils Relative: 0 %
EOS PCT: 0 %
Eosinophils Absolute: 0 10*3/uL (ref 0.0–0.7)
HEMATOCRIT: 42.1 % (ref 36.0–46.0)
HEMOGLOBIN: 13.6 g/dL (ref 12.0–15.0)
LYMPHS ABS: 2.8 10*3/uL (ref 0.7–4.0)
LYMPHS PCT: 20 %
MCH: 28.5 pg (ref 26.0–34.0)
MCHC: 32.3 g/dL (ref 30.0–36.0)
MCV: 88.1 fL (ref 78.0–100.0)
MONOS PCT: 8 %
Monocytes Absolute: 1.1 10*3/uL — ABNORMAL HIGH (ref 0.1–1.0)
NEUTROS PCT: 72 %
Neutro Abs: 10.1 10*3/uL — ABNORMAL HIGH (ref 1.7–7.7)
Platelets: 990 10*3/uL (ref 150–400)
RBC: 4.78 MIL/uL (ref 3.87–5.11)
RDW: 12.8 % (ref 11.5–15.5)
WBC: 14 10*3/uL — AB (ref 4.0–10.5)

## 2016-07-21 LAB — BASIC METABOLIC PANEL
Anion gap: 9 (ref 5–15)
BUN: 24 mg/dL — ABNORMAL HIGH (ref 6–20)
CALCIUM: 9.8 mg/dL (ref 8.9–10.3)
CO2: 23 mmol/L (ref 22–32)
CREATININE: 1.16 mg/dL — AB (ref 0.44–1.00)
Chloride: 103 mmol/L (ref 101–111)
GFR calc non Af Amer: 48 mL/min — ABNORMAL LOW (ref >60)
GFR, EST AFRICAN AMERICAN: 55 mL/min — AB (ref >60)
Glucose, Bld: 144 mg/dL — ABNORMAL HIGH (ref 65–99)
Potassium: 3.8 mmol/L (ref 3.5–5.1)
Sodium: 135 mmol/L (ref 135–145)

## 2016-07-21 LAB — URINALYSIS, ROUTINE W REFLEX MICROSCOPIC
Bilirubin Urine: NEGATIVE
Glucose, UA: NEGATIVE mg/dL
Hgb urine dipstick: NEGATIVE
Ketones, ur: NEGATIVE mg/dL
NITRITE: POSITIVE — AB
PROTEIN: NEGATIVE mg/dL
SPECIFIC GRAVITY, URINE: 1.025 (ref 1.005–1.030)
pH: 5.5 (ref 5.0–8.0)

## 2016-07-21 LAB — URINE MICROSCOPIC-ADD ON

## 2016-07-21 MED ORDER — CYANOCOBALAMIN 1000 MCG/ML IJ SOLN
1000.0000 ug | Freq: Once | INTRAMUSCULAR | 0 refills | Status: AC
Start: 2016-07-21 — End: 2016-07-21

## 2016-07-21 MED ORDER — CIPROFLOXACIN HCL 250 MG PO TABS: 250.0000 mg | ORAL_TABLET | Freq: Two times a day (BID) | ORAL | 0 refills | Status: DC

## 2016-07-21 MED ORDER — CYANOCOBALAMIN 1000 MCG/ML IJ SOLN
1000.0000 ug | Freq: Once | INTRAMUSCULAR | Status: AC
  Administered 2016-07-21: 1000 ug via INTRAMUSCULAR

## 2016-07-21 MED ORDER — HYDROXYUREA 500 MG PO CAPS: 500.0000 mg | ORAL_CAPSULE | Freq: Every day | ORAL | 1 refills | Status: DC

## 2016-07-21 MED ORDER — CYANOCOBALAMIN 1000 MCG/ML IJ SOLN
INTRAMUSCULAR | Status: AC
  Filled 2016-07-21: qty 1

## 2016-07-21 NOTE — Patient Instructions (Signed)
[place] at [facility] Discharge Instructions  RECOMMENDATIONS MADE BY THE CONSULTANT AND ANY TEST RESULTS WILL BE SENT TO YOUR REFERRING PHYSICIAN.  You were seen today by Kirby Crigler PA-C. Prescriptions given for B12, syringes and needles. Labs drawn today, will call with results.  Follow up in 3 weeks.   Thank you for choosing [place] at [facility] to provide your oncology and hematology care.  To afford each patient quality time with our provider, please arrive at least 15 minutes before your scheduled appointment time.   Beginning January 23rd 2017 lab work for the [facility] will be done in the  Main lab at [facility] on 1st floor. If you have a lab appointment with the [facility] please come in thru the  Main Entrance and check in at the main information desk  You need to re-schedule your appointment should you arrive 10 or more minutes late.  We strive to give you quality time with our providers, and arriving late affects you and other patients whose appointments are after yours.  Also, if you no show three or more times for appointments you may be dismissed from the clinic at the providers discretion.     Again, thank you for choosing [facility].  Our hope is that these requests will decrease the amount of time that you wait before being seen by our physicians.       _____________________________________________________________  Should you have questions after your visit to [facility], please contact our office at [phone] between the hours of 8:30 a.m. and 4:30 p.m.  Voicemails left after 4:30 p.m. will not be returned until the following business day.  For prescription refill requests, have your pharmacy contact our office.         Resources For Cancer Patients and their Caregivers ? American Cancer Society: Can assist with transportation, wigs, general needs, runs Look Good Feel  Better.        705-697-2244 ? Cancer Care: Provides financial assistance, online support groups, medication/co-pay assistance.  1-800-813-HOPE (854)209-5865) ? [facility] Assists [place] cancer patients and their families through emotional , educational and financial support.  706-074-1858 ? [place] DSS Where to apply for food stamps, Medicaid and utility assistance. 906-636-8525 ? RCATS: Transportation to medical appointments. 3068805965 ? Social Security Administration: May apply for disability if have a Stage IV cancer. 954 336 1141 870 613 2841 ? [employer], Disability and Transit Services: Assists with nutrition, care and transit needs. [place] Support Programs: @10RELATIVEDAYS @ > Cancer Support Group  2nd Tuesday of the month 1pm-2pm, Journey Room  > Creative Journey  3rd Tuesday of the month 1130am-1pm, Journey Room  > Look Good Feel Better  1st Wednesday of the month 10am-12 noon, Journey Room (Call [facility] to register [phone])

## 2016-07-21 NOTE — Assessment & Plan Note (Addendum)
Pernicious anemia, presenting with thrombocytosis and work-up demonstrating a low-normal B12 level.  Intrinsic factor antibody testing was positive on 07/03/2016, now on IM B12 replacement.  Patient is provided education regarding pernicious anemia.  Labs today: CBC diff.  I personally reviewed and went over laboratory results with the patient.  The results are noted within this dictation.  Continue B12 replacement as ordered.  B12 today.  Next injection will be next week.  This will be followed by monthly injections.  Supportive therapy plan is reviewed.  She is moving back to [facility] in ~ 2 weeks.  She notes a sister who can administer B12 injections at home.  Rx printed for B12 injection, to be given ~ 08/25/2016 and then monthly.  She is alsio given Rx for blunt fill tipped needles, 3 mL syringes, and 25 G 5/8" needles.

## 2016-07-21 NOTE — Progress Notes (Signed)
Pt here today for B12 injection. Pt given injection in her left deltoid. Pt tolerated well. Pt stable.

## 2016-07-21 NOTE — Progress Notes (Signed)
Colleen Nash presents today for injection per the provider's orders.  B12 administration without incident; see MAR for injection details.  Patient tolerated procedure well and without incident.  No questions or complaints noted at this time.

## 2016-07-21 NOTE — Assessment & Plan Note (Addendum)
Thrombocytosis, JAK2 NEGATIVE.  Labs today: CBC diff, CMET.  I personally reviewed and went over laboratory results with the patient.  The results are noted within this dictation.  If thrombocytosis persists, will start Hydrea 500 mg PO daily and set her up for a bone marrow aspiration and biopsy with IR.  We discussed these events.  She is provided education regarding Hydrea treatment.  Risks, benefits, alternatives, and side effects of this medication are discussed.  She is provided printed patient information regarding Hydrea as well.  She notes malodorous urine.  UA with reflex is ordered today.  She plans on moving back to [place].  She notes that ongoing appointments here will not be a problem as it given her an opportunity to visit with her family.  HOWEVER, based upon results from today and future plans, she may need regular lab work and closer follow-up than she anticipates.  If true, then we will refer her to hematology in [place], [place].  Labs in ~ 3 weeks: CBC diff, CMET.  Return in 3 weeks for follow-up.  Addendum: Platelet count today is 990,000.  Will start Hydrea 500 mg PO daily.  Medication is escribed to her pharmacy. Nursing will update the patient accordingly.  Additionally, order is placed for bone marrow aspiration and biopsy. UA returned and there is leukocytes and nitrites in urine.  I will order Cipro 250 mg PO BID x 5 days.  This too is escribed.

## 2016-07-21 NOTE — Progress Notes (Signed)
Colleen Neighbors, MD [place] [address]  Thrombocytosis [facility]) - Plan: CBC with Differential, Basic metabolic panel, CBC with Differential, Comprehensive metabolic panel, CT BIOPSY, hydroxyurea (HYDREA) 500 MG capsule, CBC with Differential, CBC with Differential  Pernicious anemia - Plan: cyanocobalamin (,VITAMIN B-12,) 1000 MCG/ML injection  Malodorous urine - Plan: Urinalysis, Routine w reflex microscopic, Urinalysis, Routine w reflex microscopic, ciprofloxacin (CIPRO) 250 MG tablet  Acute cystitis without hematuria - Plan: ciprofloxacin (CIPRO) 250 MG tablet  CURRENT THERAPY: Work-up  INTERVAL HISTORY: Colleen Nash 65 y.o. female returns for followup of Thrombocytosis, JAK2 NEGATIVE. AND Pernicious anemia, presenting with thrombocytosis and work-up demonstrating a low-normal B12 level.  Intrinsic factor antibody testing was positive on 07/03/2016, now on IM B12 replacement.  She is provided education regarding pernicious anemia.  She notes that her B12 level was not low due to an extended viral infection resulting in loose stools.  She notes malodorous urine without pelvic pain, urinary frequency, etc.  She notes that she will be returning to [facility] in ~ 2 weeks.  Review of Systems  Constitutional: Negative.  Negative for chills, fever and weight loss.  HENT: Negative.  Negative for nosebleeds.   Eyes: Negative.   Respiratory: Negative.  Negative for cough.   Cardiovascular: Negative.  Negative for chest pain.  Gastrointestinal: Negative.  Negative for blood in stool, constipation, diarrhea, melena, nausea and vomiting.  Genitourinary:       Malodorous urine  Musculoskeletal: Negative.   Skin: Negative.  Negative for itching and rash.  Neurological: Negative.  Negative for weakness.  Endo/Heme/Allergies: Negative.  Does not bruise/bleed easily.  Psychiatric/Behavioral: The patient is nervous/anxious.     Past Medical History:  Diagnosis  Date  . Anxiety   . Back pain   . Hypercholesteremia   . Panic attacks   . Pernicious anemia 07/08/2016  . Restless leg   . Thrombocytosis ([place]) 07/10/2016  . Urinary tract infection     Past Surgical History:  Procedure Laterality Date  . CESAREAN SECTION    . CHOLECYSTECTOMY    . TUBAL LIGATION      Family History  Problem Relation Age of Onset  . Heart failure Mother   . Heart disease Mother   . Cancer Father   . Heart disease Father   . Hyperlipidemia Sister   . Hypertension Sister   . Heart disease Sister     before age 74  . Varicose Veins Sister   . Heart attack Sister   . AAA (abdominal aortic aneurysm) Sister   . Heart attack Sister     Social History   Social History  . Marital status: Widowed    Spouse name: N/A  . Number of children: N/A  . Years of education: N/A   Social History Main Topics  . Smoking status: Never Smoker  . Smokeless tobacco: Never Used  . Alcohol use Yes     Comment: occ  . Drug use: No  . Sexual activity: Yes    Birth control/ protection: Surgical   Other Topics Concern  . None   Social History Narrative  . None     PHYSICAL EXAMINATION  ECOG PERFORMANCE STATUS: 0 - Asymptomatic  Vitals:   07/21/16 1450  BP: 135/90  Pulse: 99  Resp: 16  Temp: 97.8 F (36.6 C)    GENERAL:alert, no distress, well nourished, well developed, anxious, comfortable, cooperative and unaccompanied  SKIN: skin color, texture, turgor  are normal, no rashes or significant lesions HEAD: Normocephalic, No masses, lesions, tenderness or abnormalities EYES: normal, EOMI, Conjunctiva are pink and non-injected EARS: External ears normal OROPHARYNX:lips, buccal mucosa, and tongue normal and mucous membranes are moist  NECK: supple, no adenopathy, thyroid normal size, non-tender, without nodularity, trachea midline LYMPH:  no palpable lymphadenopathy BREAST:not examined LUNGS: clear to auscultation  HEART: regular rate &  rhythm ABDOMEN:abdomen soft and normal bowel sounds BACK: Back symmetric, no curvature. EXTREMITIES:less then 2 second capillary refill, no joint deformities, effusion, or inflammation, no skin discoloration, no cyanosis  NEURO: alert & oriented x 3 with fluent speech, no focal motor/sensory deficits, gait normal   LABORATORY DATA: CBC    Component Value Date/Time   WBC 14.0 (H) 07/21/2016 1615   RBC 4.78 07/21/2016 1615   HGB 13.6 07/21/2016 1615   HGB 13.3 04/26/2009 1359   HCT 42.1 07/21/2016 1615   HCT 38.8 04/26/2009 1359   PLT 990 (HH) 07/21/2016 1615   PLT 500 (H) 04/26/2009 1359   MCV 88.1 07/21/2016 1615   MCV 91.2 04/26/2009 1359   MCH 28.5 07/21/2016 1615   MCHC 32.3 07/21/2016 1615   RDW 12.8 07/21/2016 1615   RDW 12.4 04/26/2009 1359   LYMPHSABS 2.8 07/21/2016 1615   LYMPHSABS 2.0 04/26/2009 1359   MONOABS 1.1 (H) 07/21/2016 1615   MONOABS 0.3 04/26/2009 1359   EOSABS 0.0 07/21/2016 1615   EOSABS 0.1 04/26/2009 1359   BASOSABS 0.0 07/21/2016 1615   BASOSABS 0.0 04/26/2009 1359      Chemistry      Component Value Date/Time   NA 135 07/21/2016 1615   K 3.8 07/21/2016 1615   CL 103 07/21/2016 1615   CO2 23 07/21/2016 1615   BUN 24 (H) 07/21/2016 1615   CREATININE 1.16 (H) 07/21/2016 1615      Component Value Date/Time   CALCIUM 9.8 07/21/2016 1615   ALKPHOS 80 07/01/2016 1435   AST 24 07/01/2016 1435   ALT 42 07/01/2016 1435   BILITOT 0.5 07/01/2016 1435        PENDING LABS:   RADIOGRAPHIC STUDIES:  No results found.   PATHOLOGY:    ASSESSMENT AND PLAN:  Thrombocytosis (HCC) Thrombocytosis, JAK2 NEGATIVE.  Labs today: CBC diff, CMET.  I personally reviewed and went over laboratory results with the patient.  The results are noted within this dictation.  If thrombocytosis persists, will start Hydrea 500 mg PO daily and set her up for a bone marrow aspiration and biopsy with IR.  We discussed these events.  She is provided education  regarding Hydrea treatment.  Risks, benefits, alternatives, and side effects of this medication are discussed.  She is provided printed patient information regarding Hydrea as well.  She notes malodorous urine.  UA with reflex is ordered today.  She plans on moving back to [facility].  She notes that ongoing appointments here will not be a problem as it given her an opportunity to visit with her family.  HOWEVER, based upon results from today and future plans, she may need regular lab work and closer follow-up than she anticipates.  If true, then we will refer her to hematology in [place], [place].  Labs in ~ 3 weeks: CBC diff, CMET.  Return in 3 weeks for follow-up.  Addendum: Platelet count today is 990,000.  Will start Hydrea 500 mg PO daily.  Medication is escribed to her pharmacy. Nursing will update the patient accordingly.  Additionally, order is placed for  bone marrow aspiration and biopsy. UA returned and there is leukocytes and nitrites in urine.  I will order Cipro 250 mg PO BID x 5 days.  This too is escribed.   Pernicious anemia Pernicious anemia, presenting with thrombocytosis and work-up demonstrating a low-normal B12 level.  Intrinsic factor antibody testing was positive on 07/03/2016, now on IM B12 replacement.  Patient is provided education regarding pernicious anemia.  Labs today: CBC diff.  I personally reviewed and went over laboratory results with the patient.  The results are noted within this dictation.  Continue B12 replacement as ordered.  B12 today.  Next injection will be next week.  This will be followed by monthly injections.  Supportive therapy plan is reviewed.  She is moving back to [facility] in ~ 2 weeks.  She notes a sister who can administer B12 injections at home.  Rx printed for B12 injection, to be given ~ 08/25/2016 and then monthly.  She is alsio given Rx for blunt fill tipped needles, 3 mL syringes, and 25 G 5/8" needles.   ORDERS  PLACED FOR THIS ENCOUNTER: Orders Placed This Encounter  Procedures  . CT BIOPSY  . CBC with Differential  . Basic metabolic panel  . Urinalysis, Routine w reflex microscopic  . CBC with Differential  . Comprehensive metabolic panel  . Urine microscopic-add on  . CBC with Differential  . CBC with Differential    MEDICATIONS PRESCRIBED THIS ENCOUNTER: Meds ordered this encounter  Medications  . cyanocobalamin (,VITAMIN B-12,) 1000 MCG/ML injection    Sig: Inject 1 mL (1,000 mcg total) into the muscle once. Start ~ 08/25/2016 and administered monthly    Dispense:  1 mL    Refill:  0    Order Specific Question:   Supervising Provider    Answer:   Patrici Ranks [id number]  . hydroxyurea (HYDREA) 500 MG capsule    Sig: Take 1 capsule (500 mg total) by mouth daily. May take with food to minimize GI side effects.    Dispense:  30 capsule    Refill:  1    Order Specific Question:   Supervising Provider    Answer:   Patrici Ranks [id number]  . ciprofloxacin (CIPRO) 250 MG tablet    Sig: Take 1 tablet (250 mg total) by mouth 2 (two) times daily.    Dispense:  10 tablet    Refill:  0    Order Specific Question:   Supervising Provider    Answer:   Patrici Ranks [id number]    THERAPY PLAN:  Start Hydrea 500 mg PO daily with dosing adjustments as indicated.  Bone marrow aspiration and biopsy.  B12 injection today and next week, followed by monthly B12 injections at home.  All questions were answered. The patient knows to call the clinic with any problems, questions or concerns. We can certainly see the patient much sooner if necessary.  Patient and plan discussed with Dr. Ancil Linsey and she is in agreement with the aforementioned.   This note is electronically signed by: Doy Mince 07/21/2016 6:46 PM

## 2016-07-21 NOTE — Progress Notes (Signed)
CRITICAL VALUE ALERT Critical value received:  Platelets 990 Date of notification:  07/21/16 Time of notification: [id number] Critical value read back:  Yes.   Nurse who received alert:  M.Stone Spirito, LPN MD notified (1st page):  Kirby Crigler, PA-C

## 2016-07-22 ENCOUNTER — Telehealth (HOSPITAL_COMMUNITY): Payer: Self-pay | Admitting: *Deleted

## 2016-07-22 NOTE — Telephone Encounter (Signed)
Pt states that she has an appointment already for the Bone marrow biopsy. Pt has an appointment for 08/10/16 already to see Dr. Whitney Muse. Pt is aware that she has 2 Rx that she will need to pick up from her pharmacy.

## 2016-07-22 NOTE — Telephone Encounter (Signed)
-----  Message from Baird [facility], PA-C sent at 07/21/2016  6:39 PM EDT ----- Hydrea 500 mg PO daily is escribed to her pharmacy.  Orders are placed for weekly labs.  Bone marrow biopsy is ordered with IR.  Please move appt to ~ 1 week after bone marrow biopsy.

## 2016-07-28 ENCOUNTER — Encounter (HOSPITAL_BASED_OUTPATIENT_CLINIC_OR_DEPARTMENT_OTHER): Payer: Medicare Other

## 2016-07-28 ENCOUNTER — Encounter (HOSPITAL_COMMUNITY): Payer: Self-pay

## 2016-07-28 ENCOUNTER — Other Ambulatory Visit (HOSPITAL_COMMUNITY): Payer: Self-pay | Admitting: *Deleted

## 2016-07-28 ENCOUNTER — Encounter (HOSPITAL_COMMUNITY): Payer: Medicare Other

## 2016-07-28 VITALS — BP 128/88 | HR 87 | Temp 98.7°F | Resp 18

## 2016-07-28 DIAGNOSIS — D510 Vitamin B12 deficiency anemia due to intrinsic factor deficiency: Secondary | ICD-10-CM | POA: Diagnosis present

## 2016-07-28 DIAGNOSIS — Z885 Allergy status to narcotic agent status: Secondary | ICD-10-CM | POA: Diagnosis not present

## 2016-07-28 DIAGNOSIS — D473 Essential (hemorrhagic) thrombocythemia: Secondary | ICD-10-CM

## 2016-07-28 DIAGNOSIS — Z7982 Long term (current) use of aspirin: Secondary | ICD-10-CM | POA: Diagnosis not present

## 2016-07-28 DIAGNOSIS — D7589 Other specified diseases of blood and blood-forming organs: Secondary | ICD-10-CM | POA: Diagnosis not present

## 2016-07-28 DIAGNOSIS — E7800 Pure hypercholesterolemia, unspecified: Secondary | ICD-10-CM | POA: Diagnosis not present

## 2016-07-28 DIAGNOSIS — Z79899 Other long term (current) drug therapy: Secondary | ICD-10-CM | POA: Diagnosis not present

## 2016-07-28 DIAGNOSIS — D72829 Elevated white blood cell count, unspecified: Secondary | ICD-10-CM | POA: Diagnosis not present

## 2016-07-28 DIAGNOSIS — D75839 Thrombocytosis, unspecified: Secondary | ICD-10-CM

## 2016-07-28 LAB — CBC WITH DIFFERENTIAL/PLATELET
BASOS PCT: 0 %
Basophils Absolute: 0 10*3/uL (ref 0.0–0.1)
EOS ABS: 0 10*3/uL (ref 0.0–0.7)
EOS PCT: 0 %
HCT: 41.1 % (ref 36.0–46.0)
Hemoglobin: 13.4 g/dL (ref 12.0–15.0)
LYMPHS ABS: 3 10*3/uL (ref 0.7–4.0)
Lymphocytes Relative: 28 %
MCH: 29 pg (ref 26.0–34.0)
MCHC: 32.6 g/dL (ref 30.0–36.0)
MCV: 89 fL (ref 78.0–100.0)
MONO ABS: 0.9 10*3/uL (ref 0.1–1.0)
MONOS PCT: 9 %
Neutro Abs: 6.8 10*3/uL (ref 1.7–7.7)
Neutrophils Relative %: 63 %
Platelets: 1034 10*3/uL (ref 150–400)
RBC: 4.62 MIL/uL (ref 3.87–5.11)
RDW: 13.4 % (ref 11.5–15.5)
WBC: 10.8 10*3/uL — ABNORMAL HIGH (ref 4.0–10.5)

## 2016-07-28 MED ORDER — CYANOCOBALAMIN 1000 MCG/ML IJ SOLN
1000.0000 ug | Freq: Once | INTRAMUSCULAR | Status: AC
  Administered 2016-07-28: 1000 ug via INTRAMUSCULAR

## 2016-07-28 MED ORDER — HYDROXYUREA 500 MG PO CAPS: ORAL_CAPSULE | ORAL | 0 refills | Status: DC

## 2016-07-28 MED ORDER — CYANOCOBALAMIN 1000 MCG/ML IJ SOLN
INTRAMUSCULAR | Status: AC
  Filled 2016-07-28: qty 1

## 2016-07-28 NOTE — Progress Notes (Signed)
Colleen Nash presents today for injection per MD orders. B12 1,054mcg administered IM in left Upper Arm. Administration without incident. Patient tolerated well.   Vitals stable, no problems. Discharged ambulatory from clinic

## 2016-07-28 NOTE — Progress Notes (Signed)
CRITICAL VALUE ALERT  Critical value received:  Platelets 1034  Date of notification:  07/28/16  Time of notification:  [id number]  Critical value read back:Yes.    Nurse who received alert:  A. Ouida Sills, RN  MD notified:  Dr. Whitney Muse at [phone]

## 2016-07-28 NOTE — Patient Instructions (Signed)
[place] at [facility] Discharge Instructions  RECOMMENDATIONS MADE BY THE CONSULTANT AND ANY TEST RESULTS WILL BE SENT TO YOUR REFERRING PHYSICIAN.  b12 injection given today. Follow up as scheduled  Thank you for choosing [place] at [facility] to provide your oncology and hematology care.  To afford each patient quality time with our provider, please arrive at least 15 minutes before your scheduled appointment time.   Beginning January 23rd 2017 lab work for the [facility] will be done in the  Main lab at [facility] on 1st floor. If you have a lab appointment with the [facility] please come in thru the  Main Entrance and check in at the main information desk  You need to re-schedule your appointment should you arrive 10 or more minutes late.  We strive to give you quality time with our providers, and arriving late affects you and other patients whose appointments are after yours.  Also, if you no show three or more times for appointments you may be dismissed from the clinic at the providers discretion.     Again, thank you for choosing [facility].  Our hope is that these requests will decrease the amount of time that you wait before being seen by our physicians.       _____________________________________________________________  Should you have questions after your visit to [facility], please contact our office at [phone] between the hours of 8:30 a.m. and 4:30 p.m.  Voicemails left after 4:30 p.m. will not be returned until the following business day.  For prescription refill requests, have your pharmacy contact our office.         Resources For Cancer Patients and their Caregivers ? American Cancer Society: Can assist with transportation, wigs, general needs, runs Look Good Feel Better.        681-632-0671 ? Cancer Care: Provides financial assistance, online support groups,  medication/co-pay assistance.  1-800-813-HOPE 801 672 3320) ? [place] Assists [place] cancer patients and their families through emotional , educational and financial support.  518-725-7462 ? [place] DSS Where to apply for food stamps, Medicaid and utility assistance. 732-104-6323 ? [facility]: Transportation to medical appointments. 513 719 4706 ? Social Security Administration: May apply for disability if have a Stage IV cancer. 303 020 8833 708-397-8924 ? [employer], Disability and Transit Services: Assists with nutrition, care and transit needs. [place] Support Programs: @10RELATIVEDAYS @ > Cancer Support Group  2nd Tuesday of the month 1pm-2pm, Journey Room  > Creative Journey  3rd Tuesday of the month 1130am-1pm, Journey Room  > Look Good Feel Better  1st Wednesday of the month 10am-12 noon, Journey Room (Call [place] to register [phone])

## 2016-07-30 DIAGNOSIS — M19012 Primary osteoarthritis, left shoulder: Secondary | ICD-10-CM | POA: Diagnosis not present

## 2016-07-30 DIAGNOSIS — M7541 Impingement syndrome of right shoulder: Secondary | ICD-10-CM | POA: Diagnosis not present

## 2016-07-30 DIAGNOSIS — M19011 Primary osteoarthritis, right shoulder: Secondary | ICD-10-CM | POA: Diagnosis not present

## 2016-07-30 DIAGNOSIS — M7542 Impingement syndrome of left shoulder: Secondary | ICD-10-CM | POA: Diagnosis not present

## 2016-08-02 ENCOUNTER — Other Ambulatory Visit: Payer: Self-pay | Admitting: Radiology

## 2016-08-03 ENCOUNTER — Other Ambulatory Visit (HOSPITAL_COMMUNITY): Payer: Medicare Other

## 2016-08-03 ENCOUNTER — Telehealth (HOSPITAL_COMMUNITY): Payer: Self-pay | Admitting: Emergency Medicine

## 2016-08-03 NOTE — Telephone Encounter (Signed)
Pt called and said they she has to cancel her appt today and wanted to know if she could get the prescription filled that was given to her for the B12 shots and someone in her family give her the injections.  I told her that would be fine.

## 2016-08-04 ENCOUNTER — Ambulatory Visit (HOSPITAL_COMMUNITY)
Admission: RE | Admit: 2016-08-04 | Discharge: 2016-08-04 | Disposition: A | Discharge status: Home / Self Care | Payer: Medicare Other | Source: Ambulatory Visit | Attending: Oncology | Admitting: Oncology

## 2016-08-04 ENCOUNTER — Encounter (HOSPITAL_COMMUNITY): Payer: Self-pay

## 2016-08-04 DIAGNOSIS — Z9049 Acquired absence of other specified parts of digestive tract: Secondary | ICD-10-CM | POA: Insufficient documentation

## 2016-08-04 DIAGNOSIS — Z9889 Other specified postprocedural states: Secondary | ICD-10-CM | POA: Insufficient documentation

## 2016-08-04 DIAGNOSIS — D7589 Other specified diseases of blood and blood-forming organs: Secondary | ICD-10-CM | POA: Insufficient documentation

## 2016-08-04 DIAGNOSIS — Z7982 Long term (current) use of aspirin: Secondary | ICD-10-CM | POA: Insufficient documentation

## 2016-08-04 DIAGNOSIS — D473 Essential (hemorrhagic) thrombocythemia: Secondary | ICD-10-CM | POA: Diagnosis not present

## 2016-08-04 DIAGNOSIS — Z79899 Other long term (current) drug therapy: Secondary | ICD-10-CM | POA: Diagnosis not present

## 2016-08-04 DIAGNOSIS — F419 Anxiety disorder, unspecified: Secondary | ICD-10-CM | POA: Insufficient documentation

## 2016-08-04 DIAGNOSIS — D510 Vitamin B12 deficiency anemia due to intrinsic factor deficiency: Secondary | ICD-10-CM | POA: Diagnosis not present

## 2016-08-04 DIAGNOSIS — D72829 Elevated white blood cell count, unspecified: Secondary | ICD-10-CM | POA: Insufficient documentation

## 2016-08-04 DIAGNOSIS — G2581 Restless legs syndrome: Secondary | ICD-10-CM | POA: Diagnosis not present

## 2016-08-04 DIAGNOSIS — D75839 Thrombocytosis, unspecified: Secondary | ICD-10-CM

## 2016-08-04 DIAGNOSIS — E7800 Pure hypercholesterolemia, unspecified: Secondary | ICD-10-CM | POA: Insufficient documentation

## 2016-08-04 DIAGNOSIS — D649 Anemia, unspecified: Secondary | ICD-10-CM | POA: Diagnosis not present

## 2016-08-04 LAB — BASIC METABOLIC PANEL
Anion gap: 8 (ref 5–15)
BUN: 17 mg/dL (ref 6–20)
CHLORIDE: 107 mmol/L (ref 101–111)
CO2: 24 mmol/L (ref 22–32)
Calcium: 9.6 mg/dL (ref 8.9–10.3)
Creatinine, Ser: 0.7 mg/dL (ref 0.44–1.00)
GFR calc Af Amer: >60 mL/min (ref >60)
GFR calc non Af Amer: >60 mL/min (ref >60)
Glucose, Bld: 124 mg/dL — ABNORMAL HIGH (ref 65–99)
POTASSIUM: 3.9 mmol/L (ref 3.5–5.1)
SODIUM: 139 mmol/L (ref 135–145)

## 2016-08-04 LAB — CBC WITH DIFFERENTIAL/PLATELET
Basophils Absolute: 0 10*3/uL (ref 0.0–0.1)
Basophils Relative: 0 %
Eosinophils Absolute: 0.1 10*3/uL (ref 0.0–0.7)
Eosinophils Relative: 0 %
HEMATOCRIT: 42.7 % (ref 36.0–46.0)
HEMOGLOBIN: 13.7 g/dL (ref 12.0–15.0)
LYMPHS ABS: 2.8 10*3/uL (ref 0.7–4.0)
LYMPHS PCT: 18 %
MCH: 28.9 pg (ref 26.0–34.0)
MCHC: 32.1 g/dL (ref 30.0–36.0)
MCV: 90.1 fL (ref 78.0–100.0)
MONO ABS: 1.2 10*3/uL — AB (ref 0.1–1.0)
MONOS PCT: 8 %
NEUTROS ABS: 11.4 10*3/uL — AB (ref 1.7–7.7)
NEUTROS PCT: 74 %
Platelets: 806 10*3/uL — ABNORMAL HIGH (ref 150–400)
RBC: 4.74 MIL/uL (ref 3.87–5.11)
RDW: 14.4 % (ref 11.5–15.5)
WBC: 15.4 10*3/uL — ABNORMAL HIGH (ref 4.0–10.5)

## 2016-08-04 LAB — BONE MARROW EXAM

## 2016-08-04 LAB — PROTIME-INR
INR: 0.95
Prothrombin Time: 12.6 seconds (ref 11.4–15.2)

## 2016-08-04 MED ORDER — FENTANYL CITRATE (PF) 100 MCG/2ML IJ SOLN
INTRAMUSCULAR | Status: AC | PRN
  Administered 2016-08-04 (×2): 25 ug via INTRAVENOUS

## 2016-08-04 MED ORDER — HYDROCODONE-ACETAMINOPHEN 5-325 MG PO TABS
1.0000 | ORAL_TABLET | ORAL | Status: DC | PRN
  Administered 2016-08-04: 1 via ORAL
  Filled 2016-08-04: qty 1
  Filled 2016-08-04 (×2): qty 2

## 2016-08-04 MED ORDER — FENTANYL CITRATE (PF) 100 MCG/2ML IJ SOLN
25.0000 ug | Freq: Once | INTRAMUSCULAR | Status: AC
  Administered 2016-08-04: 25 ug via INTRAVENOUS
  Filled 2016-08-04: qty 2

## 2016-08-04 MED ORDER — SODIUM CHLORIDE 0.9 % IV SOLN
INTRAVENOUS | Status: DC
  Administered 2016-08-04: 10:00:00 via INTRAVENOUS

## 2016-08-04 MED ORDER — FENTANYL CITRATE (PF) 100 MCG/2ML IJ SOLN
INTRAMUSCULAR | Status: AC
  Administered 2016-08-04: 25 ug
  Filled 2016-08-04: qty 4

## 2016-08-04 MED ORDER — MIDAZOLAM HCL 2 MG/2ML IJ SOLN
INTRAMUSCULAR | Status: AC | PRN
  Administered 2016-08-04: 0.5 mg via INTRAVENOUS
  Administered 2016-08-04: 1 mg via INTRAVENOUS

## 2016-08-04 MED ORDER — MIDAZOLAM HCL 2 MG/2ML IJ SOLN
INTRAMUSCULAR | Status: AC
  Filled 2016-08-04: qty 4

## 2016-08-04 NOTE — Procedures (Signed)
Interventional Radiology Procedure Note  Procedure: CT guided aspirate and core biopsy of right iliac bone Complications: None Recommendations: - Bedrest supine x 1 hrs - Hydrocodone PRN  Pain - Follow biopsy results  Taraneh Metheney T. Kathlene Cote, M.D Pager:  [phone]

## 2016-08-04 NOTE — Discharge Instructions (Addendum)
Bone Marrow Aspiration and Bone Marrow Biopsy, Care After Refer to this sheet in the next few weeks. These instructions provide you with information about caring for yourself after your procedure. Your health care provider may also give you more specific instructions. Your treatment has been planned according to current medical practices, but problems sometimes occur. Call your health care provider if you have any problems or questions after your procedure. WHAT TO EXPECT AFTER THE PROCEDURE After your procedure, it is common to have:  Soreness or tenderness around the puncture site.  Bruising. HOME CARE INSTRUCTIONS  Take medicines only as directed by your health care provider.  Follow your health care provider's instructions about:  Puncture site care.  Bandage (dressing) changes and removal.  Bathe and shower as directed by your health care provider.  Check your puncture site every day for signs of infection. Watch for:  Redness, swelling, or pain.  Fluid, blood, or pus.  Return to your normal activities as directed by your health care provider.  Keep all follow-up visits as directed by your health care provider. This is important. SEEK MEDICAL CARE IF:  You have a fever.  You have uncontrollable bleeding.  You have redness, swelling, or pain at the site of your puncture.  You have fluid, blood, or pus coming from your puncture site.   This information is not intended to replace advice given to you by your health care provider. Make sure you discuss any questions you have with your health care provider.   Document Released: 04/17/2005 Document Revised: 02/12/2015 Document Reviewed: 09/19/2014 Elsevier Interactive Patient Education 2016 Elsevier Inc. Moderate Conscious Sedation, Adult, Care After Refer to this sheet in the next few weeks. These instructions provide you with information on caring for yourself after your procedure. Your health care provider may also give  you more specific instructions. Your treatment has been planned according to current medical practices, but problems sometimes occur. Call your health care provider if you have any problems or questions after your procedure. WHAT TO EXPECT AFTER THE PROCEDURE  After your procedure:  You may feel sleepy, clumsy, and have poor balance for several hours.  Vomiting may occur if you eat too soon after the procedure. HOME CARE INSTRUCTIONS  Do not participate in any activities where you could become injured for at least 24 hours. Do not:  Drive.  Swim.  Ride a bicycle.  Operate heavy machinery.  Cook.  Use power tools.  Climb ladders.  Work from a high place.  Do not make important decisions or sign legal documents until you are improved.  If you vomit, drink water, juice, or soup when you can drink without vomiting. Make sure you have little or no nausea before eating solid foods.  Only take over-the-counter or prescription medicines for pain, discomfort, or fever as directed by your health care provider.  Make sure you and your family fully understand everything about the medicines given to you, including what side effects may occur.  You should not drink alcohol, take sleeping pills, or take medicines that cause drowsiness for at least 24 hours.  If you smoke, do not smoke without supervision.  If you are feeling better, you may resume normal activities 24 hours after you were sedated.  Keep all appointments with your health care provider. SEEK MEDICAL CARE IF:  Your skin is pale or bluish in color.  You continue to feel nauseous or vomit.  Your pain is getting worse and is not helped by medicine.  You have bleeding or swelling.  You are still sleepy or feeling clumsy after 24 hours. SEEK IMMEDIATE MEDICAL CARE IF:  You develop a rash.  You have difficulty breathing.  You develop any type of allergic problem.  You have a fever. MAKE SURE YOU:  Understand  these instructions.  Will watch your condition.  Will get help right away if you are not doing well or get worse.   This information is not intended to replace advice given to you by your health care provider. Make sure you discuss any questions you have with your health care provider.   Document Released: 07/19/2013 Document Revised: 10/19/2014 Document Reviewed: 07/19/2013 Elsevier Interactive Patient Education [employer].

## 2016-08-04 NOTE — H&P (Signed)
Chief Complaint: Patient was seen in consultation today for bone marrow biopsy at the request of [place] S  Referring Physician(s): Baird Cancer  Supervising Physician: Aletta Edouard  Patient Status: [facility] - Out-pt  History of Present Illness: Colleen Nash is a 65 y.o. female being worked up for anemia, thrombocytosis and diffuse pain syndrome. She is referred for bone marrow biopsy. Daughter at bedside. PMHx, meds, labs, allergies reviewed. Has been NPO this am  Past Medical History:  Diagnosis Date  . Anxiety   . Back pain   . Hypercholesteremia   . Panic attacks   . Pernicious anemia 07/08/2016  . Restless leg   . Thrombocytosis ([place]) 07/10/2016  . Urinary tract infection     Past Surgical History:  Procedure Laterality Date  . CESAREAN SECTION    . CHOLECYSTECTOMY    . TUBAL LIGATION      Allergies: Morphine  Medications: Prior to Admission medications   Medication Sig Start Date End Date Taking? Authorizing Provider  aspirin EC 81 MG tablet Take 1 tablet (81 mg total) by mouth daily. 07/01/16  Yes Asiyah Cletis Media, MD  aspirin-acetaminophen-caffeine (EXCEDRIN MIGRAINE) [phone] MG per tablet Take 1-2 tablets by mouth every 6 (six) hours as needed. migraines   Yes Historical Provider, MD  bifidobacterium infantis (ALIGN) capsule Take 1 capsule by mouth daily.   Yes Historical Provider, MD  clonazePAM (KLONOPIN) 1 MG tablet Take 1 mg by mouth at bedtime. For sleep   Yes Historical Provider, MD  ezetimibe (ZETIA) 10 MG tablet Take 10 mg by mouth at bedtime.    Yes Historical Provider, MD  fenofibrate (TRICOR) 48 MG tablet Take 48 mg by mouth at bedtime.    Yes Historical Provider, MD  gabapentin (NEURONTIN) 300 MG capsule Take 600-900 mg by mouth 3 (three) times daily. 600 mg in the morning and afternoon, then 900 mg at bedtime.   Yes Historical Provider, MD  HYDROcodone-acetaminophen (NORCO/VICODIN) 5-325 MG tablet Take 2 tablets by mouth every  4 (four) hours as needed. Patient taking differently: Take 2 tablets by mouth every 4 (four) hours as needed for moderate pain or severe pain.  06/15/16  Yes Noemi Chapel, MD  hydroxyurea (HYDREA) 500 MG capsule Take 1 capsule (500 mg total) by mouth daily. May take with food to minimize GI side effects. 07/21/16  Yes Manon Hilding Kefalas, PA-C  hydroxyurea (HYDREA) 500 MG capsule Take 1 capsule alternating with 2 capsules daily. May take with food to minimize GI side effects. 07/28/16  Yes Baird Cancer, PA-C  iron polysaccharides (NIFEREX) 150 MG capsule Take 1 capsule (150 mg total) by mouth daily. 07/03/16  Yes Patrici Ranks, MD  ciprofloxacin (CIPRO) 250 MG tablet Take 1 tablet (250 mg total) by mouth 2 (two) times daily. 07/21/16   Baird Cancer, PA-C     Family History  Problem Relation Age of Onset  . Heart failure Mother   . Heart disease Mother   . Cancer Father   . Heart disease Father   . Hyperlipidemia Sister   . Hypertension Sister   . Heart disease Sister     before age 45  . Varicose Veins Sister   . Heart attack Sister   . AAA (abdominal aortic aneurysm) Sister   . Heart attack Sister     Social History   Social History  . Marital status: Widowed    Spouse name: N/A  . Number of children: N/A  . Years of  education: N/A   Social History Main Topics  . Smoking status: Never Smoker  . Smokeless tobacco: Never Used  . Alcohol use Yes     Comment: occ  . Drug use: No  . Sexual activity: Yes    Birth control/ protection: Surgical   Other Topics Concern  . None   Social History Narrative  . None     Review of Systems: A 12 point ROS discussed and pertinent positives are indicated in the HPI above.  All other systems are negative.  Review of Systems  Vital Signs: BP (!) 150/84 (BP Location: Left Arm)   Pulse 86   Temp 97.5 F (36.4 C)   Resp 20   SpO2 98%   Physical Exam  Constitutional: She is oriented to person, place, and time. She appears  well-developed and well-nourished. She appears distressed.  HENT:  Head: Normocephalic.  Mouth/Throat: Oropharynx is clear and moist.  Neck: Normal range of motion. No JVD present. No tracheal deviation present.  Cardiovascular: Normal rate, regular rhythm and normal heart sounds.   Pulmonary/Chest: Effort normal and breath sounds normal. No respiratory distress. She has no wheezes. She has no rales.  Abdominal: Soft. There is no tenderness.  Neurological: She is alert and oriented to person, place, and time.  Skin: Skin is warm and dry.  Psychiatric: She has a normal mood and affect. Judgment normal.    Mallampati Score:  MD Evaluation Airway: WNL Heart: WNL Abdomen: WNL Chest/ Lungs: WNL ASA  Classification: 2 Mallampati/Airway Score: One  Imaging: No results found.  Labs:  CBC:  Recent Labs  07/01/16 1435 07/21/16 1615 07/28/16 1402 08/04/16 0910  WBC 9.6 14.0* 10.8* 15.4*  HGB 11.3* 13.6 13.4 13.7  HCT 35.3* 42.1 41.1 42.7  PLT 1,048* 990* 1,034* 806*    COAGS:  Recent Labs  08/04/16 0910  INR 0.95    BMP:  Recent Labs  06/15/16 0820 07/01/16 1435 07/21/16 1615 08/04/16 0910  NA 137 136 135 139  K 3.7 3.5 3.8 3.9  CL 104 106 103 107  CO2 _0 GLUCOSE 115* 249* 144* 124*  BUN 10 12 24* 17  CALCIUM 8.9 8.2* 9.8 9.6  CREATININE 0.71 1.02* 1.16* 0.70  GFRNONAA >60 56* 48* >60  GFRAA >60 >60 55* >60    LIVER FUNCTION TESTS:  Recent Labs  06/15/16 0820 07/01/16 1435  BILITOT 0.7 0.5  AST 13* 24  ALT 17 42  ALKPHOS 55 80  PROT 6.6 7.0  ALBUMIN 3.4* 3.3*    TUMOR MARKERS: No results for input(s): AFPTM, CEA, CA199, CHROMGRNA in the last 8760 hours.  Assessment and Plan: Pernicious anemia Thrombocytosis Diffuse pain For CT guided bone marrow biopsy Labs reviewed. Risks and Benefits discussed with the patient including, but not limited to bleeding, infection, damage to adjacent structures or low yield requiring additional  tests. All of the patient's questions were answered, patient is agreeable to proceed. Consent signed and in chart.    Thank you for this interesting consult.  I greatly enjoyed meeting Colleen Nash and look forward to participating in their care.  A copy of this report was sent to the requesting provider on this date.  Electronically Signed: Ascencion Dike 08/04/2016, 10:09 AM   I spent a total of 20 minutes in face to face in clinical consultation, greater than 50% of which was counseling/coordinating care for bone marrow biopsy

## 2016-08-10 ENCOUNTER — Encounter (HOSPITAL_BASED_OUTPATIENT_CLINIC_OR_DEPARTMENT_OTHER): Payer: Medicare Other | Admitting: Hematology & Oncology

## 2016-08-10 ENCOUNTER — Encounter (HOSPITAL_COMMUNITY): Payer: Self-pay | Admitting: Lab

## 2016-08-10 ENCOUNTER — Encounter (HOSPITAL_COMMUNITY): Payer: Self-pay | Admitting: Hematology & Oncology

## 2016-08-10 ENCOUNTER — Encounter (HOSPITAL_COMMUNITY): Payer: Medicare Other

## 2016-08-10 ENCOUNTER — Other Ambulatory Visit (HOSPITAL_COMMUNITY): Payer: Self-pay | Admitting: Internal Medicine

## 2016-08-10 VITALS — BP 142/76 | HR 89 | Temp 97.9°F | Resp 16 | Wt 140.8 lb

## 2016-08-10 DIAGNOSIS — M79606 Pain in leg, unspecified: Secondary | ICD-10-CM

## 2016-08-10 DIAGNOSIS — D72829 Elevated white blood cell count, unspecified: Secondary | ICD-10-CM

## 2016-08-10 DIAGNOSIS — D7589 Other specified diseases of blood and blood-forming organs: Secondary | ICD-10-CM | POA: Diagnosis not present

## 2016-08-10 DIAGNOSIS — E611 Iron deficiency: Secondary | ICD-10-CM

## 2016-08-10 DIAGNOSIS — D75839 Thrombocytosis, unspecified: Secondary | ICD-10-CM

## 2016-08-10 DIAGNOSIS — G894 Chronic pain syndrome: Secondary | ICD-10-CM | POA: Diagnosis not present

## 2016-08-10 DIAGNOSIS — D473 Essential (hemorrhagic) thrombocythemia: Secondary | ICD-10-CM

## 2016-08-10 DIAGNOSIS — D510 Vitamin B12 deficiency anemia due to intrinsic factor deficiency: Secondary | ICD-10-CM

## 2016-08-10 DIAGNOSIS — Z1231 Encounter for screening mammogram for malignant neoplasm of breast: Secondary | ICD-10-CM

## 2016-08-10 DIAGNOSIS — Z885 Allergy status to narcotic agent status: Secondary | ICD-10-CM | POA: Diagnosis not present

## 2016-08-10 DIAGNOSIS — R42 Dizziness and giddiness: Secondary | ICD-10-CM | POA: Diagnosis not present

## 2016-08-10 DIAGNOSIS — E7800 Pure hypercholesterolemia, unspecified: Secondary | ICD-10-CM | POA: Diagnosis not present

## 2016-08-10 DIAGNOSIS — Z6824 Body mass index (BMI) 24.0-24.9, adult: Secondary | ICD-10-CM | POA: Diagnosis not present

## 2016-08-10 DIAGNOSIS — Z79899 Other long term (current) drug therapy: Secondary | ICD-10-CM | POA: Diagnosis not present

## 2016-08-10 DIAGNOSIS — M6281 Muscle weakness (generalized): Secondary | ICD-10-CM | POA: Diagnosis not present

## 2016-08-10 DIAGNOSIS — Z7982 Long term (current) use of aspirin: Secondary | ICD-10-CM | POA: Diagnosis not present

## 2016-08-10 DIAGNOSIS — D696 Thrombocytopenia, unspecified: Secondary | ICD-10-CM | POA: Diagnosis not present

## 2016-08-10 DIAGNOSIS — F411 Generalized anxiety disorder: Secondary | ICD-10-CM | POA: Diagnosis not present

## 2016-08-10 LAB — COMPREHENSIVE METABOLIC PANEL
ALK PHOS: 80 U/L (ref 38–126)
ALT: 49 U/L (ref 14–54)
AST: 34 U/L (ref 15–41)
Albumin: 3.9 g/dL (ref 3.5–5.0)
Anion gap: 7 (ref 5–15)
BUN: 17 mg/dL (ref 6–20)
CALCIUM: 9.3 mg/dL (ref 8.9–10.3)
CHLORIDE: 104 mmol/L (ref 101–111)
CO2: 26 mmol/L (ref 22–32)
CREATININE: 0.72 mg/dL (ref 0.44–1.00)
Glucose, Bld: 144 mg/dL — ABNORMAL HIGH (ref 65–99)
Potassium: 3.8 mmol/L (ref 3.5–5.1)
Sodium: 137 mmol/L (ref 135–145)
TOTAL PROTEIN: 7.5 g/dL (ref 6.5–8.1)
Total Bilirubin: 0.7 mg/dL (ref 0.3–1.2)

## 2016-08-10 LAB — CBC WITH DIFFERENTIAL/PLATELET
Basophils Absolute: 0 10*3/uL (ref 0.0–0.1)
Basophils Relative: 0 %
EOS PCT: 1 %
Eosinophils Absolute: 0.1 10*3/uL (ref 0.0–0.7)
HCT: 41.3 % (ref 36.0–46.0)
Hemoglobin: 13.3 g/dL (ref 12.0–15.0)
LYMPHS ABS: 2.5 10*3/uL (ref 0.7–4.0)
LYMPHS PCT: 29 %
MCH: 29.2 pg (ref 26.0–34.0)
MCHC: 32.2 g/dL (ref 30.0–36.0)
MCV: 90.8 fL (ref 78.0–100.0)
Monocytes Absolute: 0.8 10*3/uL (ref 0.1–1.0)
Monocytes Relative: 9 %
Neutro Abs: 5.3 10*3/uL (ref 1.7–7.7)
Neutrophils Relative %: 61 %
PLATELETS: 605 10*3/uL — AB (ref 150–400)
RBC: 4.55 MIL/uL (ref 3.87–5.11)
RDW: 14.7 % (ref 11.5–15.5)
WBC: 8.7 10*3/uL (ref 4.0–10.5)

## 2016-08-10 NOTE — Assessment & Plan Note (Signed)
Thrombocytosis, JAK2 NEGATIVE.  Labs today: CBC diff, CMET.  I personally reviewed and went over laboratory results with the patient.  The results are noted within this dictation.  If thrombocytosis persists, will start Hydrea 500 mg PO daily and set her up for a bone marrow aspiration and biopsy with IR.  We discussed these events.  She is provided education regarding Hydrea treatment.  Risks, benefits, alternatives, and side effects of this medication are discussed.  She is provided printed patient information regarding Hydrea as well.  She notes malodorous urine.  UA with reflex is ordered today.  She plans on moving back to [facility].  She notes that ongoing appointments here will not be a problem as it given her an opportunity to visit with her family.  HOWEVER, based upon results from today and future plans, she may need regular lab work and closer follow-up than she anticipates.  If true, then we will refer her to hematology in [place], [place].  Labs in ~ 3 weeks: CBC diff, CMET.  Return in 3 weeks for follow-up.  Addendum: Platelet count today is 990,000.  Will start Hydrea 500 mg PO daily.  Medication is escribed to her pharmacy. Nursing will update the patient accordingly.  Additionally, order is placed for bone marrow aspiration and biopsy. UA returned and there is leukocytes and nitrites in urine.  I will order Cipro 250 mg PO BID x 5 days.  This too is escribed.

## 2016-08-10 NOTE — Progress Notes (Signed)
[facility]  PROGRESS NOTE  Patient Care Team: Celene Squibb, MD as PCP - General (Internal Medicine)  CHIEF COMPLAINTS/PURPOSE OF CONSULTATION:  Thrombocytosis, essential thrombocytosis Pernicious Anemia Weakness of legs, leg pain Anxiety Negative MPD evaluation BMBX on 08/04/2016 with increase in megakaryocytes  HISTORY OF PRESENTING ILLNESS:  Colleen Nash 65 y.o. female is here for follow-up of thrombocytosis and pernicious anemia. Platelet count dating back several years has been running in the low 500K range, on 06/15/2016 platelet count was 718K, wbc count was 11.8. On 07/01/2016 platelet count was 1,048 with normal WBC count, neutrophilia was noted. Mild normocytic anemia is also noted. She was started on hydrea on 10/10 secondary to a platelet count of 990K, BMBX was obtained on 10/24.   Ms. Empie is here today accompanied by her daughter. She is wearing a right wrist brace.   The patient reports that the pain in her legs is feeling better and she is now able to get up by herself. She has been able to walk on her own since the bone marrow biopsy and liquid hydrocodone. She is experiencing hand and shoulder pain that make it difficult to put clothes on.  She states that she has been feeling better overall. She was able to go to church, shopping, and out to eat.  She wants to move back to her home in [facility] and asks how often she needs blood tests.   She denies nausea, or problems with hydrea.  She reports she still feels a little weak, but better.   She states that she feels dizziness coming from her inner ear. She also describes it as feeling like her head "is in a barrel". She's seeing her PCP Dr. Nevada Crane later today and plans on discussing this with him.   She has not had her flu shot, but plans on getting one with her PCP later today.   She asked if her low B12 is why she's "hurting". She is currently on B12 replacement.    MEDICAL HISTORY:    Past Medical History:  Diagnosis Date  . Anxiety   . Back pain   . Hypercholesteremia   . Panic attacks   . Pernicious anemia 07/08/2016  . Restless leg   . Thrombocytosis ([place]) 07/10/2016  . Urinary tract infection     SURGICAL HISTORY: Past Surgical History:  Procedure Laterality Date  . CESAREAN SECTION    . CHOLECYSTECTOMY    . TUBAL LIGATION      SOCIAL HISTORY: Social History   Social History  . Marital status: Widowed    Spouse name: N/A  . Number of children: N/A  . Years of education: N/A   Occupational History  . Not on file.   Social History Main Topics  . Smoking status: Never Smoker  . Smokeless tobacco: Never Used  . Alcohol use Yes     Comment: occ  . Drug use: No  . Sexual activity: Yes    Birth control/ protection: Surgical   Other Topics Concern  . Not on file   Social History Narrative  . No narrative on file   Widowed 4 years ago, he committed suicide on Christmas. He was in poor health.  2 children 5 grandchildren Non smoker ETOH, a glass when going out for dinner maybe Lives by herself She enjoys traveling  FAMILY HISTORY: Family History  Problem Relation Age of Onset  . Heart failure Mother   . Heart disease Mother   .  Cancer Father   . Heart disease Father   . Hyperlipidemia Sister   . Hypertension Sister   . Heart disease Sister     before age 60  . Varicose Veins Sister   . Heart attack Sister   . AAA (abdominal aortic aneurysm) Sister   . Heart attack Sister    Mother deceased at 67 yo of congestive heart failure. Smoker Father deceased at 53 yo of lung cancer. Smoker Grandmother died of Vitamin B deficiency 6 sisters.  1 sister died of infection after a hip replacement 1 brother died in [place]  ALLERGIES:  is allergic to morphine.  MEDICATIONS:  Current Outpatient Prescriptions  Medication Sig Dispense Refill  . aspirin EC 81 MG tablet Take 1 tablet (81 mg total) by mouth daily. 30 tablet 3  .  aspirin-acetaminophen-caffeine (EXCEDRIN MIGRAINE) [phone] MG per tablet Take 1-2 tablets by mouth every 6 (six) hours as needed. migraines    . bifidobacterium infantis (ALIGN) capsule Take 1 capsule by mouth daily.    . ciprofloxacin (CIPRO) 250 MG tablet Take 1 tablet (250 mg total) by mouth 2 (two) times daily. 10 tablet 0  . clonazePAM (KLONOPIN) 1 MG tablet Take 1 mg by mouth at bedtime. For sleep    . ezetimibe (ZETIA) 10 MG tablet Take 10 mg by mouth at bedtime.     . fenofibrate (TRICOR) 48 MG tablet Take 48 mg by mouth at bedtime.     . gabapentin (NEURONTIN) 300 MG capsule Take 600-900 mg by mouth 3 (three) times daily. 600 mg in the morning and afternoon, then 900 mg at bedtime.    Marland Kitchen HYDROcodone-acetaminophen (NORCO/VICODIN) 5-325 MG tablet Take 2 tablets by mouth every 4 (four) hours as needed. (Patient taking differently: Take 2 tablets by mouth every 4 (four) hours as needed for moderate pain or severe pain. ) 20 tablet 0  . hydroxyurea (HYDREA) 500 MG capsule Take 1 capsule (500 mg total) by mouth daily. May take with food to minimize GI side effects. 30 capsule 1  . hydroxyurea (HYDREA) 500 MG capsule Take 1 capsule alternating with 2 capsules daily. May take with food to minimize GI side effects. 45 capsule 0  . iron polysaccharides (NIFEREX) 150 MG capsule Take 1 capsule (150 mg total) by mouth daily. 30 capsule 3   No current facility-administered medications for this visit.     Review of Systems  HENT: Negative.   Eyes: Negative.   Respiratory: Negative.   Cardiovascular: Negative.   Gastrointestinal: Negative.  Negative for nausea.  Genitourinary: Negative.   Musculoskeletal:       Leg pain began about 1 month ago  Skin: Negative.   Neurological: Positive for dizziness and weakness.  Endo/Heme/Allergies: Negative.   Psychiatric/Behavioral:       Flat affect  All other systems reviewed and are negative. 14 point ROS was done and is otherwise as detailed above or  in HPI   PHYSICAL EXAMINATION: ECOG PERFORMANCE STATUS: 2 - Symptomatic, <50% confined to bed  Vitals:   08/10/16 1319  BP: (!) 142/76  Pulse: 89  Resp: 16  Temp: 97.9 F (36.6 C)   Filed Weights   08/10/16 1319  Weight: 140 lb 12.8 oz (63.9 kg)     Physical Exam  Constitutional: She is oriented to person, place, and time.  Thin Right wrist brace  HENT:  Head: Normocephalic and atraumatic.  Nose: Nose normal.  Mouth/Throat: Oropharynx is clear and moist. No oropharyngeal exudate.  Eyes: Conjunctivae  and EOM are normal. Pupils are equal, round, and reactive to light. Right eye exhibits no discharge. Left eye exhibits no discharge. No scleral icterus.  Neck: Normal range of motion. Neck supple. No tracheal deviation present. No thyromegaly present.  Cardiovascular: Normal rate, regular rhythm and normal heart sounds.  Exam reveals no gallop and no friction rub.   No murmur heard. Pulmonary/Chest: Effort normal and breath sounds normal. She has no wheezes. She has no rales.  Abdominal: Soft. Bowel sounds are normal. She exhibits no distension and no mass. There is no tenderness. There is no rebound and no guarding.  Musculoskeletal: Normal range of motion. She exhibits no edema.  Lymphadenopathy:    She has no cervical adenopathy.  Neurological: She is alert and oriented to person, place, and time. She has normal reflexes. No cranial nerve deficit. Gait normal. Coordination normal.  Skin: Skin is warm and dry. No rash noted.  Psychiatric: Mood, memory, affect and judgment normal.  Nursing note and vitals reviewed.   LABORATORY DATA:  I have reviewed the data as listed Lab Results  Component Value Date   WBC 8.7 08/10/2016   HGB 13.3 08/10/2016   HCT 41.3 08/10/2016   MCV 90.8 08/10/2016   PLT 605 (H) 08/10/2016   CMP     Component Value Date/Time   NA 137 08/10/2016 1349   K 3.8 08/10/2016 1349   CL 104 08/10/2016 1349   CO2 26 08/10/2016 1349   GLUCOSE 144 (H)  08/10/2016 1349   BUN 17 08/10/2016 1349   CREATININE 0.72 08/10/2016 1349   CALCIUM 9.3 08/10/2016 1349   PROT 7.5 08/10/2016 1349   ALBUMIN 3.9 08/10/2016 1349   AST 34 08/10/2016 1349   ALT 49 08/10/2016 1349   ALKPHOS 80 08/10/2016 1349   BILITOT 0.7 08/10/2016 1349   GFRNONAA >60 08/10/2016 1349   GFRAA >60 08/10/2016 1349   Results for SHYIA, FILLINGIM (MRN [id number])   Ref. Range 10/10/2008 19:53 03/19/2009 15:15 04/26/2009 13:59 07/15/2012 20:19 06/15/2016 08:20 07/01/2016 14:35  Platelets Latest Ref Range: 150 - 400 K/uL 511 (H) 538 (H) 500 (H) 408 (H) 718 (H) 1,048 (HH)    RADIOGRAPHIC STUDIES: I have personally reviewed the radiological images as listed and agreed with the findings in the report. No results found. Study Result   CLINICAL DATA:  Anemia, thrombocytosis and diffuse pain syndrome.  EXAM: CT GUIDED BONE MARROW ASPIRATION AND BIOPSY  ANESTHESIA/SEDATION: Versed 1.5 mg IV, Fentanyl 50 mcg IV  Total Moderate Sedation Time:  14 minutes.  The patient's level of consciousness and physiologic status were continuously monitored during the procedure by Radiology nursing.  PROCEDURE: The procedure risks, benefits, and alternatives were explained to the patient. Questions regarding the procedure were encouraged and answered. The patient understands and consents to the procedure. A time out was performed prior to initiating the procedure.  The right gluteal region was prepped with chlorhexidine. Sterile gown and sterile gloves were used for the procedure. Local anesthesia was provided with 1% Lidocaine.  Under CT guidance, an 11 gauge On Control bone cutting needle was advanced from a posterior approach into the right iliac bone. Needle positioning was confirmed with CT. Initial non heparinized and heparinized aspirate samples were obtained of bone marrow. Core biopsy was performed via the On Control drill  needle.  COMPLICATIONS: None  FINDINGS: Inspection of initial aspirate did reveal visible particles. Intact core biopsy sample was obtained.  IMPRESSION: CT guided bone marrow biopsy of right posterior iliac  bone with both aspirate and core samples obtained.   Electronically Signed   By: Aletta Edouard M.D.   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 She was given reading information about the disease. We spent time discussing this and discussing hydrea. We discussed that the goal for her platelet count is 450K. We also discussed MPD in a general sense. If she is moving back to [facility] I advised her that she will need to continue to follow with a hematologist.   Bone marrow biopsy results reviewed. Results are noted above. Cytogenetics are WNL.    I have refilled her B12. This will be sent to her [place] and her daughter will call with this information.   She will return as needed.   All questions were answered. The patient knows to call the clinic with any problems, questions or concerns.  This document serves as a record of services personally performed by Ancil Linsey, MD. It was created on her behalf by Martinique Casey, a trained medical scribe. The creation of this record is based on the scribe's personal observations and the provider's statements to them. This document has been checked and approved by the attending provider.  I have reviewed the above documentation for accuracy and completeness and I agree with the above.  This note was electronically signed.  Molli Hazard, MD  08/10/2016 5:42 PM

## 2016-08-10 NOTE — Patient Instructions (Addendum)
[place] at [facility] Discharge Instructions  RECOMMENDATIONS MADE BY THE CONSULTANT AND ANY TEST RESULTS WILL BE SENT TO YOUR REFERRING PHYSICIAN.  You saw Dr.Penland today. We will refer you to a hematologist in [facility]. Labs today. See Amy at checkout for appointments.  Thank you for choosing [facility] at [facility] to provide your oncology and hematology care.  To afford each patient quality time with our provider, please arrive at least 15 minutes before your scheduled appointment time.   Beginning January 23rd 2017 lab work for the [facility] will be done in the  Main lab at [facility] on 1st floor. If you have a lab appointment with the [facility] please come in thru the  Main Entrance and check in at the main information desk  You need to re-schedule your appointment should you arrive 10 or more minutes late.  We strive to give you quality time with our providers, and arriving late affects you and other patients whose appointments are after yours.  Also, if you no show three or more times for appointments you may be dismissed from the clinic at the providers discretion.     Again, thank you for choosing [facility].  Our hope is that these requests will decrease the amount of time that you wait before being seen by our physicians.       _____________________________________________________________  Should you have questions after your visit to [facility], please contact our office at [phone] between the hours of 8:30 a.m. and 4:30 p.m.  Voicemails left after 4:30 p.m. will not be returned until the following business day.  For prescription refill requests, have your pharmacy contact our office.         Resources For Cancer Patients and their Caregivers ? American Cancer Society: Can assist with transportation, wigs, general needs, runs Look Good Feel Better.         9384882951 ? Cancer Care: Provides financial assistance, online support groups, medication/co-pay assistance.  1-800-813-HOPE 941-346-9375) ? [place] Assists [place] cancer patients and their families through emotional , educational and financial support.  (586) 593-1653 ? [place] DSS Where to apply for food stamps, Medicaid and utility assistance. 602-632-6189 ? [facility]: Transportation to medical appointments. (774)264-5908 ? Social Security Administration: May apply for disability if have a Stage IV cancer. 762 380 8909 9073470535 ? [employer], Disability and Transit Services: Assists with nutrition, care and transit needs. [place] Support Programs: @10RELATIVEDAYS @ > Cancer Support Group  2nd Tuesday of the month 1pm-2pm, Journey Room  > Creative Journey  3rd Tuesday of the month 1130am-1pm, Journey Room  > Look Good Feel Better  1st Wednesday of the month 10am-12 noon, Journey Room (Call [facility] to register [phone])

## 2016-08-10 NOTE — Progress Notes (Unsigned)
Referral sent to [facility] fax [phone]  Ph [phone].  Records faxed on 10/30

## 2016-08-10 NOTE — Assessment & Plan Note (Signed)
Pernicious anemia, presenting with thrombocytosis and work-up demonstrating a low-normal B12 level.  Intrinsic factor antibody testing was positive on 07/03/2016, now on IM B12 replacement.  Patient is provided education regarding pernicious anemia.  Labs today: CBC diff.  I personally reviewed and went over laboratory results with the patient.  The results are noted within this dictation.  Continue B12 replacement as ordered.  B12 today.  Next injection will be next week.  This will be followed by monthly injections.  Supportive therapy plan is reviewed.  She is moving back to [facility] in ~ 2 weeks.  She notes a sister who can administer B12 injections at home.  Rx printed for B12 injection, to be given ~ 08/25/2016 and then monthly.  She is alsio given Rx for blunt fill tipped needles, 3 mL syringes, and 25 G 5/8" needles.

## 2016-08-13 LAB — CHROMOSOME ANALYSIS, BONE MARROW

## 2016-08-17 DIAGNOSIS — M5412 Radiculopathy, cervical region: Secondary | ICD-10-CM | POA: Diagnosis not present

## 2016-08-17 DIAGNOSIS — M791 Myalgia: Secondary | ICD-10-CM | POA: Diagnosis not present

## 2016-08-17 DIAGNOSIS — M542 Cervicalgia: Secondary | ICD-10-CM | POA: Diagnosis not present

## 2016-08-18 ENCOUNTER — Other Ambulatory Visit (HOSPITAL_COMMUNITY): Payer: Self-pay | Admitting: Oncology

## 2016-08-18 ENCOUNTER — Encounter (HOSPITAL_COMMUNITY): Payer: Medicare Other | Attending: Hematology & Oncology

## 2016-08-18 DIAGNOSIS — D473 Essential (hemorrhagic) thrombocythemia: Secondary | ICD-10-CM

## 2016-08-18 DIAGNOSIS — D75839 Thrombocytosis, unspecified: Secondary | ICD-10-CM

## 2016-08-18 LAB — CBC WITH DIFFERENTIAL/PLATELET
BASOS PCT: 0 %
Basophils Absolute: 0 10*3/uL (ref 0.0–0.1)
EOS PCT: 1 %
Eosinophils Absolute: 0.1 10*3/uL (ref 0.0–0.7)
HEMATOCRIT: 39.7 % (ref 36.0–46.0)
Hemoglobin: 12.8 g/dL (ref 12.0–15.0)
Lymphocytes Relative: 27 %
Lymphs Abs: 1.9 10*3/uL (ref 0.7–4.0)
MCH: 29.2 pg (ref 26.0–34.0)
MCHC: 32.2 g/dL (ref 30.0–36.0)
MCV: 90.4 fL (ref 78.0–100.0)
MONO ABS: 0.8 10*3/uL (ref 0.1–1.0)
MONOS PCT: 11 %
NEUTROS ABS: 4.4 10*3/uL (ref 1.7–7.7)
Neutrophils Relative %: 61 %
PLATELETS: 789 10*3/uL — AB (ref 150–400)
RBC: 4.39 MIL/uL (ref 3.87–5.11)
RDW: 15.5 % (ref 11.5–15.5)
WBC: 7.1 10*3/uL (ref 4.0–10.5)

## 2016-08-19 ENCOUNTER — Other Ambulatory Visit (HOSPITAL_COMMUNITY): Payer: Self-pay

## 2016-08-19 DIAGNOSIS — D75839 Thrombocytosis, unspecified: Secondary | ICD-10-CM

## 2016-08-19 DIAGNOSIS — D473 Essential (hemorrhagic) thrombocythemia: Secondary | ICD-10-CM

## 2016-08-19 MED ORDER — HYDROXYUREA 500 MG PO CAPS: 1000.0000 mg | ORAL_CAPSULE | Freq: Every day | ORAL | 1 refills | Status: DC

## 2016-08-20 ENCOUNTER — Inpatient Hospital Stay (HOSPITAL_COMMUNITY)
Admission: EM | Admit: 2016-08-20 | Discharge: 2016-08-22 | DRG: 641 | Disposition: A | Discharge status: Home / Self Care | Payer: Medicare Other | Attending: Internal Medicine | Admitting: Internal Medicine

## 2016-08-20 ENCOUNTER — Other Ambulatory Visit: Payer: Self-pay

## 2016-08-20 ENCOUNTER — Telehealth (HOSPITAL_COMMUNITY): Payer: Self-pay

## 2016-08-20 ENCOUNTER — Encounter (HOSPITAL_COMMUNITY): Payer: Self-pay

## 2016-08-20 ENCOUNTER — Encounter (HOSPITAL_COMMUNITY): Payer: Self-pay | Admitting: *Deleted

## 2016-08-20 DIAGNOSIS — Z79899 Other long term (current) drug therapy: Secondary | ICD-10-CM

## 2016-08-20 DIAGNOSIS — K5900 Constipation, unspecified: Secondary | ICD-10-CM

## 2016-08-20 DIAGNOSIS — G8929 Other chronic pain: Secondary | ICD-10-CM | POA: Diagnosis present

## 2016-08-20 DIAGNOSIS — Z7982 Long term (current) use of aspirin: Secondary | ICD-10-CM | POA: Diagnosis not present

## 2016-08-20 DIAGNOSIS — G894 Chronic pain syndrome: Secondary | ICD-10-CM | POA: Diagnosis present

## 2016-08-20 DIAGNOSIS — D75839 Thrombocytosis, unspecified: Secondary | ICD-10-CM | POA: Diagnosis present

## 2016-08-20 DIAGNOSIS — D473 Essential (hemorrhagic) thrombocythemia: Secondary | ICD-10-CM | POA: Diagnosis present

## 2016-08-20 DIAGNOSIS — K529 Noninfective gastroenteritis and colitis, unspecified: Secondary | ICD-10-CM | POA: Diagnosis present

## 2016-08-20 DIAGNOSIS — E869 Volume depletion, unspecified: Secondary | ICD-10-CM | POA: Diagnosis not present

## 2016-08-20 DIAGNOSIS — J471 Bronchiectasis with (acute) exacerbation: Secondary | ICD-10-CM

## 2016-08-20 DIAGNOSIS — R42 Dizziness and giddiness: Secondary | ICD-10-CM | POA: Diagnosis not present

## 2016-08-20 DIAGNOSIS — R0602 Shortness of breath: Secondary | ICD-10-CM | POA: Diagnosis not present

## 2016-08-20 DIAGNOSIS — R7989 Other specified abnormal findings of blood chemistry: Secondary | ICD-10-CM | POA: Diagnosis present

## 2016-08-20 DIAGNOSIS — Z885 Allergy status to narcotic agent status: Secondary | ICD-10-CM

## 2016-08-20 DIAGNOSIS — R110 Nausea: Secondary | ICD-10-CM

## 2016-08-20 DIAGNOSIS — R0902 Hypoxemia: Secondary | ICD-10-CM | POA: Diagnosis not present

## 2016-08-20 DIAGNOSIS — F418 Other specified anxiety disorders: Secondary | ICD-10-CM | POA: Diagnosis present

## 2016-08-20 DIAGNOSIS — D510 Vitamin B12 deficiency anemia due to intrinsic factor deficiency: Secondary | ICD-10-CM | POA: Diagnosis present

## 2016-08-20 DIAGNOSIS — R079 Chest pain, unspecified: Secondary | ICD-10-CM | POA: Diagnosis not present

## 2016-08-20 DIAGNOSIS — E7800 Pure hypercholesterolemia, unspecified: Secondary | ICD-10-CM | POA: Diagnosis present

## 2016-08-20 DIAGNOSIS — R112 Nausea with vomiting, unspecified: Secondary | ICD-10-CM | POA: Diagnosis present

## 2016-08-20 DIAGNOSIS — F329 Major depressive disorder, single episode, unspecified: Secondary | ICD-10-CM | POA: Diagnosis present

## 2016-08-20 DIAGNOSIS — R109 Unspecified abdominal pain: Secondary | ICD-10-CM | POA: Diagnosis present

## 2016-08-20 LAB — COMPREHENSIVE METABOLIC PANEL
ALK PHOS: 89 U/L (ref 38–126)
ALT: 68 U/L — AB (ref 14–54)
AST: 64 U/L — AB (ref 15–41)
Albumin: 3.5 g/dL (ref 3.5–5.0)
Anion gap: 10 (ref 5–15)
BILIRUBIN TOTAL: 1.1 mg/dL (ref 0.3–1.2)
BUN: 19 mg/dL (ref 6–20)
CALCIUM: 9 mg/dL (ref 8.9–10.3)
CHLORIDE: 99 mmol/L — AB (ref 101–111)
CO2: 23 mmol/L (ref 22–32)
CREATININE: 0.71 mg/dL (ref 0.44–1.00)
Glucose, Bld: 198 mg/dL — ABNORMAL HIGH (ref 65–99)
Potassium: 3.8 mmol/L (ref 3.5–5.1)
Sodium: 132 mmol/L — ABNORMAL LOW (ref 135–145)
TOTAL PROTEIN: 7.2 g/dL (ref 6.5–8.1)

## 2016-08-20 LAB — CBC WITH DIFFERENTIAL/PLATELET
BASOS ABS: 0 10*3/uL (ref 0.0–0.1)
Basophils Relative: 0 %
Eosinophils Absolute: 0 10*3/uL (ref 0.0–0.7)
Eosinophils Relative: 0 %
HEMATOCRIT: 39.9 % (ref 36.0–46.0)
HEMOGLOBIN: 13.1 g/dL (ref 12.0–15.0)
LYMPHS ABS: 0.8 10*3/uL (ref 0.7–4.0)
LYMPHS PCT: 12 %
MCH: 29.4 pg (ref 26.0–34.0)
MCHC: 32.8 g/dL (ref 30.0–36.0)
MCV: 89.5 fL (ref 78.0–100.0)
Monocytes Absolute: 0.2 10*3/uL (ref 0.1–1.0)
Monocytes Relative: 3 %
NEUTROS ABS: 5.7 10*3/uL (ref 1.7–7.7)
Neutrophils Relative %: 85 %
Platelets: 615 10*3/uL — ABNORMAL HIGH (ref 150–400)
RBC: 4.46 MIL/uL (ref 3.87–5.11)
RDW: 15.9 % — ABNORMAL HIGH (ref 11.5–15.5)
WBC: 6.7 10*3/uL (ref 4.0–10.5)

## 2016-08-20 LAB — LIPASE, BLOOD: LIPASE: 20 U/L (ref 11–51)

## 2016-08-20 MED ORDER — PROCHLORPERAZINE EDISYLATE 5 MG/ML IJ SOLN
10.0000 mg | Freq: Once | INTRAMUSCULAR | Status: AC
  Administered 2016-08-20: 10 mg via INTRAVENOUS
  Filled 2016-08-20: qty 2

## 2016-08-20 MED ORDER — ONDANSETRON HCL 8 MG PO TABS: 8.0000 mg | ORAL_TABLET | Freq: Three times a day (TID) | ORAL | 1 refills | Status: AC | PRN

## 2016-08-20 MED ORDER — SODIUM CHLORIDE 0.9 % IV BOLUS (SEPSIS)
500.0000 mL | Freq: Once | INTRAVENOUS | Status: AC
  Administered 2016-08-20: 500 mL via INTRAVENOUS

## 2016-08-20 NOTE — Telephone Encounter (Signed)
Patient called stating she was nauseated and had been all last night and today. As long as she sits still she feels okay but when she moves she feels nauseated again. She did increase her Hydrea as instructed but thinks she took 3 pills yesterday instead of 2. She is still able to drink but does not want to eat. She last had a BM yesterday. No one else is sick in the home.   After reviewing with PA-C, prescription for zofran sent to her pharmacy.   Notified patient and instructed her to call if no better.

## 2016-08-20 NOTE — ED Notes (Signed)
o2 sats on room air 88-89% placed on 2l or via [place] at this time

## 2016-08-20 NOTE — ED Triage Notes (Addendum)
Pt has had n/v since increasing her Hydroxyurea to 3 tablets yesterday. Pt increased her meds yesterday because "that's what she thought the doctor said." Pt states she talked to Dr. Donald Pore nurse today and was told she was only supposed to take 2 tabs. Pt has been nauseated since taking an extra tablet. Pt states she has no energy. Pt took 4mg  of Zofran at 6pm with no relief.

## 2016-08-21 ENCOUNTER — Inpatient Hospital Stay (HOSPITAL_COMMUNITY): Payer: Medicare Other

## 2016-08-21 ENCOUNTER — Emergency Department (HOSPITAL_COMMUNITY): Payer: Medicare Other

## 2016-08-21 ENCOUNTER — Encounter (HOSPITAL_COMMUNITY): Payer: Self-pay | Admitting: Internal Medicine

## 2016-08-21 DIAGNOSIS — D510 Vitamin B12 deficiency anemia due to intrinsic factor deficiency: Secondary | ICD-10-CM | POA: Diagnosis not present

## 2016-08-21 DIAGNOSIS — R42 Dizziness and giddiness: Secondary | ICD-10-CM | POA: Diagnosis not present

## 2016-08-21 DIAGNOSIS — R112 Nausea with vomiting, unspecified: Secondary | ICD-10-CM | POA: Diagnosis present

## 2016-08-21 DIAGNOSIS — E869 Volume depletion, unspecified: Secondary | ICD-10-CM | POA: Diagnosis present

## 2016-08-21 DIAGNOSIS — F329 Major depressive disorder, single episode, unspecified: Secondary | ICD-10-CM | POA: Diagnosis present

## 2016-08-21 DIAGNOSIS — Z79899 Other long term (current) drug therapy: Secondary | ICD-10-CM | POA: Diagnosis not present

## 2016-08-21 DIAGNOSIS — G894 Chronic pain syndrome: Secondary | ICD-10-CM | POA: Diagnosis present

## 2016-08-21 DIAGNOSIS — R109 Unspecified abdominal pain: Secondary | ICD-10-CM | POA: Diagnosis present

## 2016-08-21 DIAGNOSIS — Z885 Allergy status to narcotic agent status: Secondary | ICD-10-CM | POA: Diagnosis not present

## 2016-08-21 DIAGNOSIS — R7989 Other specified abnormal findings of blood chemistry: Secondary | ICD-10-CM | POA: Diagnosis present

## 2016-08-21 DIAGNOSIS — F418 Other specified anxiety disorders: Secondary | ICD-10-CM | POA: Diagnosis present

## 2016-08-21 DIAGNOSIS — D473 Essential (hemorrhagic) thrombocythemia: Secondary | ICD-10-CM | POA: Diagnosis not present

## 2016-08-21 DIAGNOSIS — R0602 Shortness of breath: Secondary | ICD-10-CM | POA: Diagnosis not present

## 2016-08-21 DIAGNOSIS — Z7982 Long term (current) use of aspirin: Secondary | ICD-10-CM | POA: Diagnosis not present

## 2016-08-21 DIAGNOSIS — K5900 Constipation, unspecified: Secondary | ICD-10-CM | POA: Diagnosis not present

## 2016-08-21 DIAGNOSIS — G8929 Other chronic pain: Secondary | ICD-10-CM | POA: Diagnosis present

## 2016-08-21 DIAGNOSIS — R079 Chest pain, unspecified: Secondary | ICD-10-CM | POA: Diagnosis not present

## 2016-08-21 DIAGNOSIS — K529 Noninfective gastroenteritis and colitis, unspecified: Secondary | ICD-10-CM | POA: Diagnosis present

## 2016-08-21 DIAGNOSIS — E7800 Pure hypercholesterolemia, unspecified: Secondary | ICD-10-CM | POA: Diagnosis present

## 2016-08-21 DIAGNOSIS — J471 Bronchiectasis with (acute) exacerbation: Secondary | ICD-10-CM | POA: Diagnosis not present

## 2016-08-21 LAB — URINALYSIS, ROUTINE W REFLEX MICROSCOPIC
BILIRUBIN URINE: NEGATIVE
Glucose, UA: NEGATIVE mg/dL
KETONES UR: NEGATIVE mg/dL
Leukocytes, UA: NEGATIVE
NITRITE: NEGATIVE
Protein, ur: NEGATIVE mg/dL
Specific Gravity, Urine: 1.01 (ref 1.005–1.030)
pH: 6 (ref 5.0–8.0)

## 2016-08-21 LAB — D-DIMER, QUANTITATIVE (NOT AT ARMC): D DIMER QUANT: 4.55 ug{FEU}/mL — AB (ref 0.00–0.50)

## 2016-08-21 LAB — URINE MICROSCOPIC-ADD ON

## 2016-08-21 LAB — VITAMIN B12: Vitamin B-12: 608 pg/mL (ref 180–914)

## 2016-08-21 LAB — CK: Total CK: 22 U/L — ABNORMAL LOW (ref 38–234)

## 2016-08-21 LAB — TSH: TSH: 0.419 u[IU]/mL (ref 0.350–4.500)

## 2016-08-21 MED ORDER — ASPIRIN EC 81 MG PO TBEC
81.0000 mg | DELAYED_RELEASE_TABLET | Freq: Every day | ORAL | Status: DC
  Administered 2016-08-21 – 2016-08-22 (×2): 81 mg via ORAL
  Filled 2016-08-21 (×2): qty 1

## 2016-08-21 MED ORDER — IOPAMIDOL (ISOVUE-370) INJECTION 76%
100.0000 mL | Freq: Once | INTRAVENOUS | Status: AC | PRN
  Administered 2016-08-21: 100 mL via INTRAVENOUS

## 2016-08-21 MED ORDER — ONDANSETRON HCL 4 MG/2ML IJ SOLN
4.0000 mg | Freq: Four times a day (QID) | INTRAMUSCULAR | Status: DC | PRN
Start: 2016-08-21 — End: 2016-08-22
  Administered 2016-08-21 (×2): 4 mg via INTRAVENOUS
  Filled 2016-08-21 (×2): qty 2

## 2016-08-21 MED ORDER — POLYETHYLENE GLYCOL 3350 17 G PO PACK
17.0000 g | PACK | Freq: Every day | ORAL | Status: DC
  Administered 2016-08-21: 17 g via ORAL
  Filled 2016-08-21: qty 1

## 2016-08-21 MED ORDER — HEPARIN SODIUM (PORCINE) 5000 UNIT/ML IJ SOLN
5000.0000 [IU] | Freq: Three times a day (TID) | INTRAMUSCULAR | Status: DC
  Administered 2016-08-21 – 2016-08-22 (×4): 5000 [IU] via SUBCUTANEOUS
  Filled 2016-08-21 (×4): qty 1

## 2016-08-21 MED ORDER — ONDANSETRON HCL 4 MG PO TABS
4.0000 mg | ORAL_TABLET | Freq: Four times a day (QID) | ORAL | Status: DC | PRN
  Administered 2016-08-21: 4 mg via ORAL
  Filled 2016-08-21: qty 1

## 2016-08-21 MED ORDER — KCL IN DEXTROSE-NACL 20-5-0.9 MEQ/L-%-% IV SOLN
INTRAVENOUS | Status: DC
  Administered 2016-08-21 (×2): via INTRAVENOUS
  Administered 2016-08-22: 125 mL/h via INTRAVENOUS
  Administered 2016-08-22: 08:00:00 via INTRAVENOUS

## 2016-08-21 MED ORDER — HYDROMORPHONE HCL 1 MG/ML IJ SOLN
0.5000 mg | INTRAMUSCULAR | Status: DC | PRN
  Administered 2016-08-21: 0.5 mg via INTRAVENOUS
  Filled 2016-08-21: qty 0.5

## 2016-08-21 MED ORDER — KETOROLAC TROMETHAMINE 30 MG/ML IJ SOLN
30.0000 mg | Freq: Four times a day (QID) | INTRAMUSCULAR | Status: DC | PRN
  Administered 2016-08-21 (×2): 30 mg via INTRAVENOUS
  Filled 2016-08-21 (×2): qty 1

## 2016-08-21 MED ORDER — CLONAZEPAM 0.5 MG PO TABS
1.0000 mg | ORAL_TABLET | Freq: Every day | ORAL | Status: DC
  Administered 2016-08-21: 1 mg via ORAL
  Filled 2016-08-21: qty 2

## 2016-08-21 MED ORDER — SODIUM CHLORIDE 0.9 % IV BOLUS (SEPSIS)
500.0000 mL | Freq: Once | INTRAVENOUS | Status: AC
  Administered 2016-08-21: 500 mL via INTRAVENOUS

## 2016-08-21 MED ORDER — DOCUSATE SODIUM 100 MG PO CAPS
100.0000 mg | ORAL_CAPSULE | Freq: Two times a day (BID) | ORAL | Status: DC
  Administered 2016-08-21 – 2016-08-22 (×2): 100 mg via ORAL
  Filled 2016-08-21 (×2): qty 1

## 2016-08-21 MED ORDER — LEVOFLOXACIN IN D5W 750 MG/150ML IV SOLN
750.0000 mg | Freq: Once | INTRAVENOUS | Status: DC
  Filled 2016-08-21: qty 150

## 2016-08-21 MED ORDER — PROMETHAZINE HCL 25 MG/ML IJ SOLN
12.5000 mg | Freq: Once | INTRAMUSCULAR | Status: AC
  Administered 2016-08-21: 12.5 mg via INTRAVENOUS
  Filled 2016-08-21: qty 1

## 2016-08-21 MED ORDER — HYDROMORPHONE HCL 1 MG/ML IJ SOLN
0.5000 mg | Freq: Once | INTRAMUSCULAR | Status: AC
  Administered 2016-08-21: 0.5 mg via INTRAVENOUS
  Filled 2016-08-21: qty 1

## 2016-08-21 NOTE — H&P (Signed)
History and Physical    Colleen Nash [id number] DOB: 1950-11-05 DOA: 08/20/2016  PCP: Wende Neighbors, MD  Patient coming from: Home.    Chief Complaint:  Nausea, vomiting, and diffuse pain.   HPI: Colleen Nash is an 65 y.o. female with hx of obesity who had undergone Tx with HCG injections previously, hx of pernicious anemia with positive antibody for intrinsic factor, on montly B12 injection, thrombocytosis with platelet count over 1 million, started on Hydrea exactly one month ago (Dr Whitney Muse), hx of chronic pain on legs, back, arms, improved with one week of steroids, underwent epidural injections, accidentally took a few more pills of Hydrea, presented to the ER with nausea and vomiting.  She was given antiemetics.  Work up included a oxygen sat of 88 percent, and resulted in a negative CT angiogram, but incidentally showed bronchiectasis and atelectasis.  Hospitalist was then asked to admit her for possible pneurmonia, but she was not coughing, having SOB, and had no leukocytosis, but she did have a low grade temp of 100 F.  She had sed rate of 28, CRP 2.5, and negative rhematoid factors.  She hadn't been able to eat well, and likely had clinical volume depletion.     ED Course:  See above.  Rewiew of Systems:  Constitutional: Negative for malaise, fever and chills. No significant weight loss or weight gain Eyes: Negative for eye pain, redness and discharge, diplopia, visual changes, or flashes of light. ENMT: Negative for ear pain, hoarseness, nasal congestion, sinus pressure and sore throat. No headaches; tinnitus, drooling, or problem swallowing. Cardiovascular: Negative for chest pain, palpitations, diaphoresis, dyspnea and peripheral edema. ; No orthopnea, PND Respiratory: Negative for cough, hemoptysis, wheezing and stridor. No pleuritic chestpain. Gastrointestinal: Negative for diarrhea, constipation,  melena, blood in stool, hematemesis, jaundice and rectal bleeding.      Genitourinary: Negative for frequency, dysuria, incontinence,flank pain and hematuria; Musculoskeletal: Negative for back pain and neck pain. Negative for swelling and trauma.;  Skin: . Negative for pruritus, rash, abrasions, bruising and skin lesion.; ulcerations Neuro: Negative for headache, lightheadedness and neck stiffness. Negative for altered level of consciousness , altered mental status, extremity weakness, burning feet, involuntary movement, seizure and syncope.  Psych: negative for anxiety, depression, insomnia, tearfulness, panic attacks, hallucinations, paranoia, suicidal or homicidal ideation    Past Medical History:  Diagnosis Date  . Anxiety   . Back pain   . Hypercholesteremia   . Panic attacks   . Pernicious anemia 07/08/2016  . Restless leg   . Thrombocytosis ([place]) 07/10/2016  . Urinary tract infection      Past Surgical History:  Procedure Laterality Date  . CESAREAN SECTION    . CHOLECYSTECTOMY    . TUBAL LIGATION       reports that she has never smoked. She has never used smokeless tobacco. She reports that she drinks alcohol. She reports that she does not use drugs.  Allergies  Allergen Reactions  . Morphine Shortness Of Breath    Family History  Problem Relation Age of Onset  . Heart failure Mother   . Heart disease Mother   . Cancer Father   . Heart disease Father   . Hyperlipidemia Sister   . Hypertension Sister   . Heart disease Sister     before age 77  . Varicose Veins Sister   . Heart attack Sister   . AAA (abdominal aortic aneurysm) Sister   . Heart attack Sister      Prior  to Admission medications   Medication Sig Start Date End Date Taking? Authorizing Provider  aspirin EC 81 MG tablet Take 1 tablet (81 mg total) by mouth daily. 07/01/16   Asiyah Cletis Media, MD  aspirin-acetaminophen-caffeine (EXCEDRIN MIGRAINE) [phone] MG per tablet Take 1-2 tablets by mouth every 6 (six) hours as needed. migraines    Historical Provider, MD   bifidobacterium infantis (ALIGN) capsule Take 1 capsule by mouth daily.    Historical Provider, MD  ciprofloxacin (CIPRO) 250 MG tablet Take 1 tablet (250 mg total) by mouth 2 (two) times daily. 07/21/16   Baird Cancer, PA-C  clonazePAM (KLONOPIN) 1 MG tablet Take 1 mg by mouth at bedtime. For sleep    Historical Provider, MD  ezetimibe (ZETIA) 10 MG tablet Take 10 mg by mouth at bedtime.     Historical Provider, MD  fenofibrate (TRICOR) 48 MG tablet Take 48 mg by mouth at bedtime.     Historical Provider, MD  gabapentin (NEURONTIN) 300 MG capsule Take 600-900 mg by mouth 3 (three) times daily. 600 mg in the morning and afternoon, then 900 mg at bedtime.    Historical Provider, MD  HYDROcodone-acetaminophen (NORCO/VICODIN) 5-325 MG tablet Take 2 tablets by mouth every 4 (four) hours as needed. Patient taking differently: Take 2 tablets by mouth every 4 (four) hours as needed for moderate pain or severe pain.  06/15/16   Noemi Chapel, MD  hydroxyurea (HYDREA) 500 MG capsule Take 1 capsule alternating with 2 capsules daily. May take with food to minimize GI side effects. 07/28/16   Baird Cancer, PA-C  hydroxyurea (HYDREA) 500 MG capsule Take 2 capsules (1,000 mg total) by mouth daily. May take with food to minimize GI side effects. 08/19/16   Patrici Ranks, MD  iron polysaccharides (NIFEREX) 150 MG capsule Take 1 capsule (150 mg total) by mouth daily. 07/03/16   Patrici Ranks, MD  ondansetron (ZOFRAN) 8 MG tablet Take 1 tablet (8 mg total) by mouth every 8 (eight) hours as needed for nausea or vomiting. 08/20/16   Patrici Ranks, MD    Physical Exam: Vitals:   08/21/16 0330 08/21/16 0357 08/21/16 0400 08/21/16 0401  BP: 115/67     Pulse: 80     Resp: 21     Temp:  99.4 F (37.4 C)  99.4 F (37.4 C)  TempSrc:  Oral  Oral  SpO2: 97%  94%   Weight:      Height:          Constitutional: NAD, calm, comfortable Vitals:   08/21/16 0330 08/21/16 0357 08/21/16 0400 08/21/16 0401   BP: 115/67     Pulse: 80     Resp: 21     Temp:  99.4 F (37.4 C)  99.4 F (37.4 C)  TempSrc:  Oral  Oral  SpO2: 97%  94%   Weight:      Height:       Eyes: PERRL, lids and conjunctivae normal ENMT: Mucous membranes are moist. Posterior pharynx clear of any exudate or lesions.Normal dentition.  Neck: normal, supple, no masses, no thyromegaly Respiratory: clear to auscultation bilaterally, no wheezing, no crackles. Normal respiratory effort. No accessory muscle use.  Cardiovascular: Regular rate and rhythm, no murmurs / rubs / gallops. No extremity edema. 2+ pedal pulses. No carotid bruits.  Abdomen: no tenderness, no masses palpated. No hepatosplenomegaly. Bowel sounds positive.  Musculoskeletal: no clubbing / cyanosis. No joint deformity upper and lower extremities. Good ROM, no contractures. Normal  muscle tone.  Skin: no rashes, lesions, ulcers. No induration Neurologic: CN 2-12 grossly intact. Sensation intact, DTR normal. Strength 5/5 in all 4.  Psychiatric: Normal judgment and insight. Alert and oriented x 3. Normal mood.  No trigger point tenderness.     Labs on Admission: I have personally reviewed following labs and imaging studies  CBC:  Recent Labs Lab 08/18/16 1308 08/20/16 2122  WBC 7.1 6.7  NEUTROABS 4.4 5.7  HGB 12.8 13.1  HCT 39.7 39.9  MCV 90.4 89.5  PLT 789* [id number]*   Basic Metabolic Panel:  Recent Labs Lab 08/20/16 2122  NA 132*  K 3.8  CL 99*  CO2 23  GLUCOSE 198*  BUN 19  CREATININE 0.71  CALCIUM 9.0   GFR: Estimated Creatinine Clearance: 61.4 mL/min (by C-G formula based on SCr of 0.71 mg/dL). Liver Function Tests:  Recent Labs Lab 08/20/16 2122  AST 64*  ALT 68*  ALKPHOS 89  BILITOT 1.1  PROT 7.2  ALBUMIN 3.5    Recent Labs Lab 08/20/16 2122  LIPASE 20   Urine analysis:    Component Value Date/Time   COLORURINE YELLOW 08/21/2016 0404   APPEARANCEUR CLEAR 08/21/2016 0404   LABSPEC 1.010 08/21/2016 0404   PHURINE 6.0  08/21/2016 0404   GLUCOSEU NEGATIVE 08/21/2016 0404   HGBUR MODERATE (A) 08/21/2016 0404   BILIRUBINUR NEGATIVE 08/21/2016 0404   KETONESUR NEGATIVE 08/21/2016 0404   PROTEINUR NEGATIVE 08/21/2016 0404   UROBILINOGEN 0.2 02/14/2012 0834   NITRITE NEGATIVE 08/21/2016 0404   LEUKOCYTESUR NEGATIVE 08/21/2016 0404    Radiological Exams on Admission: Dg Chest 2 View  Result Date: 08/21/2016 CLINICAL DATA:  Generalized chest pain and weakness. Nausea and vomiting since yesterday. EXAM: CHEST  2 VIEW COMPARISON:  07/15/2012 FINDINGS: The heart is top-normal in size. Aortic atherosclerosis without aneurysm. Bibasilar atelectasis. No overt pulmonary edema. No pneumonic consolidations. There are left third through seventh rib fractures which are subacute to chronic in appearance. No pneumothorax. No effusion. IMPRESSION: Bibasilar atelectasis. Aortic atherosclerosis. No pneumonic consolidation or pneumothorax. Left third through seventh rib fractures, subacute to chronic in appearance. Electronically Signed   By: Ashley Royalty M.D.   On: 08/21/2016 01:05   Ct Angio Chest Pe W And/or Wo Contrast  Result Date: 08/21/2016 CLINICAL DATA:  Shortness of breath and dizziness. Nausea and vomiting for 2 days. History of epidural 2 weeks ago. Pernicious anemia and thrombocytosis. No leukocytosis. EXAM: CT ANGIOGRAPHY CHEST WITH CONTRAST TECHNIQUE: Multidetector CT imaging of the chest was performed using the standard protocol during bolus administration of intravenous contrast. Multiplanar CT image reconstructions and MIPs were obtained to evaluate the vascular anatomy. CONTRAST:  100 cc IV Isovue 370 COMPARISON:  Same day chest radiograph. FINDINGS: Cardiovascular: Top normal-sized cardiac silhouette with coronary arteriosclerosis. Trace posterior pericardial effusion. No aortic aneurysm or dissection. No acute pulmonary embolus. Mediastinum/Nodes: There are mildly enlarged lymph nodes within the mediastinum and  right hila, the largest is 11 mm short axis along the right paratracheal portion of the mediastinum, 7 mm short axis prevascular and 15 mm cardiophrenic. Soft tissue fullness of the right hilum consistent with hilar lymphadenopathy as well measuring up to 15 mm. No endoluminal abnormality within the visualized trachea and proximal bronchi. Lungs/Pleura: There is bronchiectasis to both lower lobes with atelectasis left greater than right. No effusion or pneumothorax. Upper Abdomen: No acute abnormality. Musculoskeletal: No chest wall abnormality. No acute or significant osseous findings. Review of the MIP images confirms the above findings. IMPRESSION: No  acute pulmonary embolus. Bibasilar atelectasis left greater than right with bronchiectasis. Mildly enlarged mediastinal and right hilar lymph nodes possibly reactive in etiology. Electronically Signed   By: Ashley Royalty M.D.   On: 08/21/2016 02:38    EKG: Independently reviewed.  Assessment/Plan Principal Problem:   Nausea & vomiting Active Problems:   Pernicious anemia   Thrombocytosis (HCC)   Chronic pain disorder   Nausea and vomiting    PLAN:   In summary, this patient has mostly chronic medical problems, and the differentials for her symptoms of pain, malaise, decrease appetite, weakness, is broad.  She should therefore follow up with her PCP whom she had been seeing for many years.  She would benefit seeing a rheumatologist, as she could have Adult Still's Syndrome, polymyalgia or polymyositis, other immune problems, etc.  She could have side effects from [facility], though the common side effects including rash, nausea, or vasculitis aren't present.  She looks weak and dehydrated, and thus I will admit her for IVF and some pain control.  Will check B12, HIV, TSH, thiamine, and CPK levels.  Will hold her Hydrea at this time to see if her symptoms would improve.  She may need to rechallenge with her Lyrica, as she was not taken it long enough, in  fear of gaining weight.   Her platelet count at this time is in the 600K.  She will need a close follow up her PCP upon discharge.  Thank you so much for allowing me to participate in her care.  Good Day.     DVT prophylaxis: SubQ heparin Code Status: FULL CODE.  Family Communication: Daughter Wells Guiles at bedside.  Disposition Plan: likely to home.  Consults called: None. Admission status: Inpatient.    Aldea Avis MD FACP. [facility]  If 7PM-7AM, please contact night-coverage [email or web address] Password [email or web address]  08/21/2016, 5:31 AM

## 2016-08-21 NOTE — Progress Notes (Signed)
Inpatient Diabetes Program Recommendations  AACE/ADA: New Consensus Statement on Inpatient Glycemic Control (2015)  Target Ranges:  Prepandial:   less than 140 mg/dL      Peak postprandial:   less than 180 mg/dL (1-2 hours)      Critically ill patients:  140 - 180 mg/dL   No results found for: GLUCAP, HGBA1C  Review of Glycemic Control  Diabetes history: none Outpatient Diabetes medications: none Current orders for Inpatient glycemic control: none  Inpatient Diabetes Program Recommendations:   Per ADA recommendations "consider performing an A1C on all patients with diabetes or hyperglycemia admitted to the hospital if not performed in the prior 3 months".  Thank you,  Windy Carina, RN, BSN Diabetes Coordinator Inpatient Diabetes Program [phone] (Team Pager)

## 2016-08-21 NOTE — ED Notes (Signed)
hospitalitis at bedside

## 2016-08-21 NOTE — ED Provider Notes (Signed)
[place] DEPT Provider Note   CSN: [id number] Arrival date & time: 08/20/16  2021     History   Chief Complaint Chief Complaint  Patient presents with  . Emesis    HPI Colleen Nash is a 65 y.o. female with a past medical history of pernicious anemia and thrombocytosis. She presents to the emergency Department with chief complaint of severe nausea and vomiting. History is given by the patient and her daughter. The patient states she's had 2 days of severe vomiting. She complains of dizziness with standing at headache. She has had chronic diarrhea but denies any acute changes. She denies constipation. Unsure if she has had fevers at home. No contacts with similar symptoms. She has some abdominal pain but states that she feels that it is tender from retching. She has not been able to hold down any foods or fluids at home. According to her daughter. The patient normally takes alternating morning doses of hydroxyurea 1 pill on 1 day, then 2 pills the next day. There was some apparent miscommunication and the patient took 2 pills yesterday morning, then 3 pills at night, then 2 pills this morning. Nausea and vomiting became significantly worse today. She denies sob, CP, hemoptysis.  HPI  Past Medical History:  Diagnosis Date  . Anxiety   . Back pain   . Hypercholesteremia   . Panic attacks   . Pernicious anemia 07/08/2016  . Restless leg   . Thrombocytosis ([place]) 07/10/2016  . Urinary tract infection     Patient Active Problem List   Diagnosis Date Noted  . Nausea without vomiting 08/20/2016  . Thrombocytosis ([place]) 07/10/2016  . Pernicious anemia 07/08/2016  . Abdominal aneurysm without mention of rupture 03/01/2014  . DYSPEPSIA 06/05/2009    Past Surgical History:  Procedure Laterality Date  . CESAREAN SECTION    . CHOLECYSTECTOMY    . TUBAL LIGATION      OB History    No data available       Home Medications    Prior to Admission medications   Medication Sig  Start Date End Date Taking? Authorizing Provider  aspirin EC 81 MG tablet Take 1 tablet (81 mg total) by mouth daily. 07/01/16   Asiyah Cletis Media, MD  aspirin-acetaminophen-caffeine (EXCEDRIN MIGRAINE) [phone] MG per tablet Take 1-2 tablets by mouth every 6 (six) hours as needed. migraines    Historical Provider, MD  bifidobacterium infantis (ALIGN) capsule Take 1 capsule by mouth daily.    Historical Provider, MD  ciprofloxacin (CIPRO) 250 MG tablet Take 1 tablet (250 mg total) by mouth 2 (two) times daily. 07/21/16   Baird Cancer, PA-C  clonazePAM (KLONOPIN) 1 MG tablet Take 1 mg by mouth at bedtime. For sleep    Historical Provider, MD  ezetimibe (ZETIA) 10 MG tablet Take 10 mg by mouth at bedtime.     Historical Provider, MD  fenofibrate (TRICOR) 48 MG tablet Take 48 mg by mouth at bedtime.     Historical Provider, MD  gabapentin (NEURONTIN) 300 MG capsule Take 600-900 mg by mouth 3 (three) times daily. 600 mg in the morning and afternoon, then 900 mg at bedtime.    Historical Provider, MD  HYDROcodone-acetaminophen (NORCO/VICODIN) 5-325 MG tablet Take 2 tablets by mouth every 4 (four) hours as needed. Patient taking differently: Take 2 tablets by mouth every 4 (four) hours as needed for moderate pain or severe pain.  06/15/16   Noemi Chapel, MD  hydroxyurea (HYDREA) 500 MG capsule Take  1 capsule alternating with 2 capsules daily. May take with food to minimize GI side effects. 07/28/16   Baird Cancer, PA-C  hydroxyurea (HYDREA) 500 MG capsule Take 2 capsules (1,000 mg total) by mouth daily. May take with food to minimize GI side effects. 08/19/16   Patrici Ranks, MD  iron polysaccharides (NIFEREX) 150 MG capsule Take 1 capsule (150 mg total) by mouth daily. 07/03/16   Patrici Ranks, MD  ondansetron (ZOFRAN) 8 MG tablet Take 1 tablet (8 mg total) by mouth every 8 (eight) hours as needed for nausea or vomiting. 08/20/16   Patrici Ranks, MD    Family History Family History    Problem Relation Age of Onset  . Heart failure Mother   . Heart disease Mother   . Cancer Father   . Heart disease Father   . Hyperlipidemia Sister   . Hypertension Sister   . Heart disease Sister     before age 74  . Varicose Veins Sister   . Heart attack Sister   . AAA (abdominal aortic aneurysm) Sister   . Heart attack Sister     Social History Social History  Substance Use Topics  . Smoking status: Never Smoker  . Smokeless tobacco: Never Used  . Alcohol use Yes     Comment: occ     Allergies   Morphine   Review of Systems Review of Systems Ten systems reviewed and are negative for acute change, except as noted in the HPI.    Physical Exam Updated Vital Signs BP 126/88 (BP Location: Left Arm)   Pulse 93   Temp 100.7 F (38.2 C) (Temporal)   Resp 19   Ht 5\' 5"  (1.651 m)   Wt 63.5 kg   SpO2 98%   BMI 23.30 kg/m   Physical Exam  Constitutional: She is oriented to person, place, and time. She appears well-developed and well-nourished. She is cooperative. She is easily aroused. She has a sickly appearance. No distress.  HENT:  Head: Normocephalic and atraumatic.  Mouth/Throat: Mucous membranes are dry.  Eyes: Conjunctivae and EOM are normal. Pupils are equal, round, and reactive to light. No scleral icterus.  Neck: Normal range of motion.  Cardiovascular: Normal rate, regular rhythm and normal heart sounds.  Exam reveals no gallop and no friction rub.   No murmur heard. Pulmonary/Chest: Effort normal and breath sounds normal. No respiratory distress.  Abdominal: Soft. Bowel sounds are normal. She exhibits no distension and no mass. There is no tenderness. There is no guarding.  Neurological: She is alert, oriented to person, place, and time and easily aroused.  Skin: Skin is warm and dry. She is not diaphoretic.  Nursing note and vitals reviewed.    ED Treatments / Results  Labs (all labs ordered are listed, but only abnormal results are  displayed) Labs Reviewed  CBC WITH DIFFERENTIAL/PLATELET - Abnormal; Notable for the following:       Result Value   RDW 15.9 (*)    Platelets 615 (*)    All other components within normal limits  COMPREHENSIVE METABOLIC PANEL - Abnormal; Notable for the following:    Sodium 132 (*)    Chloride 99 (*)    Glucose, Bld 198 (*)    AST 64 (*)    ALT 68 (*)    All other components within normal limits  LIPASE, BLOOD    EKG  EKG Interpretation  Date/Time:  Thursday August 20 2016 23:04:11 EST Ventricular Rate:  92 PR Interval:    QRS Duration: 102 QT Interval:  390 QTC Calculation: 483 R Axis:   50 Text Interpretation:  Sinus rhythm Borderline repolarization abnormality No significant change since last tracing Confirmed by Christy Gentles  MD, DONALD ([id number]) on 08/20/2016 11:39:41 PM       Radiology No results found.  Procedures Procedures (including critical care time)  Medications Ordered in ED Medications  sodium chloride 0.9 % bolus 500 mL (not administered)  sodium chloride 0.9 % bolus 500 mL (500 mLs Intravenous New Bag/Given 08/20/16 2346)  prochlorperazine (COMPAZINE) injection 10 mg (10 mg Intravenous Given 08/20/16 2345)     Initial Impression / Assessment and Plan / ED Course  I have reviewed the triage vital signs and the nursing notes.  Pertinent labs & imaging results that were available during my care of the patient were reviewed by me and considered in my medical decision making (see chart for details).  Clinical Course    Patient here with nausea, vomiting. She has an elevated temperature to 100.7. No active vomiting at this time, however, feeling extremely nauseated. She has dry oral mucosa. Abdominal exam is benign.  Patient will need  Further evaluation with D-dimer, UA. Given phenergan and dilaudid.  I have given Sign out to dr. Christy Gentles who will assume care of the patient.  Final Clinical Impressions(s) / ED Diagnoses   Final diagnoses:  None     New Prescriptions New Prescriptions   No medications on file     Margarita Mail, PA-C 08/21/16 0144    Ripley Fraise, MD 08/21/16 [phone]

## 2016-08-21 NOTE — Progress Notes (Signed)
PROGRESS NOTE    Colleen Nash  [id number] DOB: 05-14-1951 DOA: 08/20/2016 PCP: Wende Neighbors, MD    Brief Narrative:  65 y.o. female with hx of obesity who had undergone Tx with HCG injections previously, hx of pernicious anemia with positive antibody for intrinsic factor, on montly B12 injection, thrombocytosis with platelet count over 1 million, started on Hydrea exactly one month ago (Dr Whitney Muse), hx of chronic pain on legs, back, arms, improved with one week of steroids, underwent epidural injections, accidentally took a few more pills of Hydrea, presented to the ER with nausea and vomiting.  She was given antiemetics.  Work up included a oxygen sat of 88 percent, and resulted in a negative CT angiogram, but incidentally showed bronchiectasis and atelectasis.  Hospitalist was then asked to admit her for possible pneurmonia, but she was not coughing, having SOB, and had no leukocytosis, but she did have a low grade temp of 100 F.  She had sed rate of 28, CRP 2.5, and negative rhematoid factors.  She hadn't been able to eat well, and likely had clinical volume depletion.  Assessment & Plan:   Principal Problem:   Nausea & vomiting Active Problems:   Pernicious anemia   Thrombocytosis (HCC)   Chronic pain disorder   Nausea and vomiting   1. Nausea vomiting, abdominal pain 1. Unclear etiology 2. CT chest personally reviewed. Stool was seen in the upper quadrants on that study. 3. Follow-up abdominal x-ray ordered with findings of stool in the hepatic flexure/descending colon, possible in the transverse colon 4. Patient reports no bowel movement over the past 2 days 5. We'll order trial of MiraLAX. Thus far, patient has one reported bowel movement. 2. Pernicious anemia 1. Followed by hematology, on Hydrea 2. Appears stable at present 3. Repeat CBC in the morning 3. Thrombocytosis 1. Stable at present 2. Per above, follow CBC 4. Chronic pain 1. On narcotics per home regimen,  however patient claims she is not taking Norco regularly. 2. Continue analgesics as needed 5. Headache 1. Unclear etiology. 2. Asian has reported some improvement with trial of Toradol  DVT prophylaxis: Heparin subcutaneous Code Status: Full code Family Communication: Patient in room, family not at bedside Disposition Plan: Possible discharge in 24-48 hours  Consultants:     Procedures:     Antimicrobials: Anti-infectives    Start     Dose/Rate Route Frequency Ordered Stop   08/21/16 0430  levofloxacin (LEVAQUIN) IVPB 750 mg  Status:  Discontinued     750 mg 100 mL/hr over 90 Minutes Intravenous  Once 08/21/16 0416 08/21/16 0524      Subjective: Complaining of continued nausea and generalized abdominal discomfort  Objective: Vitals:   08/21/16 0357 08/21/16 0400 08/21/16 0401 08/21/16 0719  BP:    119/62  Pulse:    83  Resp:    20  Temp: 99.4 F (37.4 C)  99.4 F (37.4 C) 97.5 F (36.4 C)  TempSrc: Oral  Oral Oral  SpO2:  94%  97%  Weight:    63 kg (139 lb)  Height:    5\' 7"  (1.702 m)    Intake/Output Summary (Last 24 hours) at 08/21/16 1325 Last data filed at 08/21/16 1211  Gross per 24 hour  Intake              180 ml  Output                0 ml  Net  180 ml   Filed Weights   08/20/16 2057 08/21/16 0719  Weight: 63.5 kg (140 lb) 63 kg (139 lb)    Examination:  General exam: Appears calm and comfortable  Respiratory system: Clear to auscultation. Respiratory effort normal. Cardiovascular system: S1 & S2 heard, RRR Gastrointestinal system: Abdomen is nondistended, soft and nontender. No organomegaly or masses felt. Normal bowel sounds heard. Central nervous system: Alert and oriented. No focal neurological deficits. Extremities: Symmetric 5 x 5 power. Skin: No rashes, lesions Psychiatry: Judgement and insight appear normal. Mood & affect appropriate.   Data Reviewed: I have personally reviewed following labs and imaging  studies  CBC:  Recent Labs Lab 08/18/16 1308 08/20/16 2122  WBC 7.1 6.7  NEUTROABS 4.4 5.7  HGB 12.8 13.1  HCT 39.7 39.9  MCV 90.4 89.5  PLT 789* [id number]*   Basic Metabolic Panel:  Recent Labs Lab 08/20/16 2122  NA 132*  K 3.8  CL 99*  CO2 23  GLUCOSE 198*  BUN 19  CREATININE 0.71  CALCIUM 9.0   GFR: Estimated Creatinine Clearance: 66.4 mL/min (by C-G formula based on SCr of 0.71 mg/dL). Liver Function Tests:  Recent Labs Lab 08/20/16 2122  AST 64*  ALT 68*  ALKPHOS 89  BILITOT 1.1  PROT 7.2  ALBUMIN 3.5    Recent Labs Lab 08/20/16 2122  LIPASE 20   No results for input(s): AMMONIA in the last 168 hours. Coagulation Profile: No results for input(s): INR, PROTIME in the last 168 hours. Cardiac Enzymes:  Recent Labs Lab 08/21/16 0924  CKTOTAL 22*   BNP (last 3 results) No results for input(s): PROBNP in the last 8760 hours. HbA1C: No results for input(s): HGBA1C in the last 72 hours. CBG: No results for input(s): GLUCAP in the last 168 hours. Lipid Profile: No results for input(s): CHOL, HDL, LDLCALC, TRIG, CHOLHDL, LDLDIRECT in the last 72 hours. Thyroid Function Tests:  Recent Labs  08/21/16 0924  TSH 0.419   Anemia Panel: No results for input(s): VITAMINB12, FOLATE, FERRITIN, TIBC, IRON, RETICCTPCT in the last 72 hours. Sepsis Labs: No results for input(s): PROCALCITON, LATICACIDVEN in the last 168 hours.  No results found for this or any previous visit (from the past 240 hour(s)).   Radiology Studies: Dg Chest 2 View  Result Date: 08/21/2016 CLINICAL DATA:  Generalized chest pain and weakness. Nausea and vomiting since yesterday. EXAM: CHEST  2 VIEW COMPARISON:  07/15/2012 FINDINGS: The heart is top-normal in size. Aortic atherosclerosis without aneurysm. Bibasilar atelectasis. No overt pulmonary edema. No pneumonic consolidations. There are left third through seventh rib fractures which are subacute to chronic in appearance. No  pneumothorax. No effusion. IMPRESSION: Bibasilar atelectasis. Aortic atherosclerosis. No pneumonic consolidation or pneumothorax. Left third through seventh rib fractures, subacute to chronic in appearance. Electronically Signed   By: Ashley Royalty M.D.   On: 08/21/2016 01:05   Ct Angio Chest Pe W And/or Wo Contrast  Result Date: 08/21/2016 CLINICAL DATA:  Shortness of breath and dizziness. Nausea and vomiting for 2 days. History of epidural 2 weeks ago. Pernicious anemia and thrombocytosis. No leukocytosis. EXAM: CT ANGIOGRAPHY CHEST WITH CONTRAST TECHNIQUE: Multidetector CT imaging of the chest was performed using the standard protocol during bolus administration of intravenous contrast. Multiplanar CT image reconstructions and MIPs were obtained to evaluate the vascular anatomy. CONTRAST:  100 cc IV Isovue 370 COMPARISON:  Same day chest radiograph. FINDINGS: Cardiovascular: Top normal-sized cardiac silhouette with coronary arteriosclerosis. Trace posterior pericardial effusion. No aortic aneurysm or  dissection. No acute pulmonary embolus. Mediastinum/Nodes: There are mildly enlarged lymph nodes within the mediastinum and right hila, the largest is 11 mm short axis along the right paratracheal portion of the mediastinum, 7 mm short axis prevascular and 15 mm cardiophrenic. Soft tissue fullness of the right hilum consistent with hilar lymphadenopathy as well measuring up to 15 mm. No endoluminal abnormality within the visualized trachea and proximal bronchi. Lungs/Pleura: There is bronchiectasis to both lower lobes with atelectasis left greater than right. No effusion or pneumothorax. Upper Abdomen: No acute abnormality. Musculoskeletal: No chest wall abnormality. No acute or significant osseous findings. Review of the MIP images confirms the above findings. IMPRESSION: No acute pulmonary embolus. Bibasilar atelectasis left greater than right with bronchiectasis. Mildly enlarged mediastinal and right hilar  lymph nodes possibly reactive in etiology. Electronically Signed   By: Ashley Royalty M.D.   On: 08/21/2016 02:38   Dg Abd Portable 1v  Result Date: 08/21/2016 CLINICAL DATA:  Nausea.  Constipation. EXAM: PORTABLE ABDOMEN - 1 VIEW COMPARISON:  None. FINDINGS: Normal bowel gas pattern.  No significant increase in colonic stool. No evidence of renal or ureteral stones. Soft tissues are unremarkable. Mild levoscoliosis of the lumbar spine with disc degenerative change most evident at L3-L4. No bone lesions. IMPRESSION: 1. No acute findings.  No evidence of bowel obstruction. 2. No radiographic evidence of increased colonic stool. Electronically Signed   By: Lajean Manes M.D.   On: 08/21/2016 10:46    Scheduled Meds: . aspirin EC  81 mg Oral Daily  . clonazePAM  1 mg Oral QHS  . heparin  5,000 Units Subcutaneous Q8H  . polyethylene glycol  17 g Oral Daily   Continuous Infusions: . dextrose 5 % and 0.9 % NaCl with KCl 20 mEq/L 125 mL/hr at 08/21/16 0742     LOS: 0 days   Ayiden Milliman, Orpah Melter, MD [facility] Pager [phone]  If 7PM-7AM, please contact night-coverage [email or web address] Password [email or web address] 08/21/2016, 1:25 PM

## 2016-08-21 NOTE — Progress Notes (Signed)
Pt continues to complain of headache, notified Dr Wyline Copas.  Verbal order for Toradol 30 mg IV Q6H PRN given.  Will continue to monitor pt.

## 2016-08-22 DIAGNOSIS — D473 Essential (hemorrhagic) thrombocythemia: Secondary | ICD-10-CM

## 2016-08-22 DIAGNOSIS — D510 Vitamin B12 deficiency anemia due to intrinsic factor deficiency: Secondary | ICD-10-CM

## 2016-08-22 LAB — COMPREHENSIVE METABOLIC PANEL
ALBUMIN: 2.9 g/dL — AB (ref 3.5–5.0)
ALT: 50 U/L (ref 14–54)
ANION GAP: 6 (ref 5–15)
AST: 36 U/L (ref 15–41)
Alkaline Phosphatase: 68 U/L (ref 38–126)
BUN: 7 mg/dL (ref 6–20)
CHLORIDE: 110 mmol/L (ref 101–111)
CO2: 25 mmol/L (ref 22–32)
Calcium: 8.6 mg/dL — ABNORMAL LOW (ref 8.9–10.3)
Creatinine, Ser: 0.59 mg/dL (ref 0.44–1.00)
GFR calc Af Amer: >60 mL/min (ref >60)
GFR calc non Af Amer: >60 mL/min (ref >60)
GLUCOSE: 134 mg/dL — AB (ref 65–99)
POTASSIUM: 3.9 mmol/L (ref 3.5–5.1)
SODIUM: 141 mmol/L (ref 135–145)
Total Bilirubin: 0.4 mg/dL (ref 0.3–1.2)
Total Protein: 6.1 g/dL — ABNORMAL LOW (ref 6.5–8.1)

## 2016-08-22 LAB — CBC
HEMATOCRIT: 34.9 % — AB (ref 36.0–46.0)
HEMOGLOBIN: 11 g/dL — AB (ref 12.0–15.0)
MCH: 28.8 pg (ref 26.0–34.0)
MCHC: 31.5 g/dL (ref 30.0–36.0)
MCV: 91.4 fL (ref 78.0–100.0)
Platelets: 553 10*3/uL — ABNORMAL HIGH (ref 150–400)
RBC: 3.82 MIL/uL — ABNORMAL LOW (ref 3.87–5.11)
RDW: 16.5 % — ABNORMAL HIGH (ref 11.5–15.5)
WBC: 3.2 10*3/uL — ABNORMAL LOW (ref 4.0–10.5)

## 2016-08-22 LAB — HIV ANTIBODY (ROUTINE TESTING W REFLEX): HIV Screen 4th Generation wRfx: NONREACTIVE

## 2016-08-22 MED ORDER — DOCUSATE SODIUM 100 MG PO CAPS: 100.0000 mg | ORAL_CAPSULE | Freq: Two times a day (BID) | ORAL | 0 refills | Status: AC

## 2016-08-22 MED ORDER — POLYETHYLENE GLYCOL 3350 17 G PO PACK: 17.0000 g | PACK | Freq: Every day | ORAL | 0 refills | Status: DC

## 2016-08-22 NOTE — Progress Notes (Signed)
Discharge instruction reviewed with patient and IV removed.

## 2016-08-22 NOTE — Discharge Summary (Signed)
Physician Discharge Summary  Colleen Nash TGG:[id number] DOB: [birth year] DOA: 08/20/2016  PCP: Wende Neighbors, MD  Admit date: 08/20/2016 Discharge date: 08/22/2016  Admitted From: Home Disposition:  Home  Recommendations for Outpatient Follow-up:  1. Follow up with PCP in 1-2 weeks  Discharge Condition:Stable CODE STATUS:Full Code Diet recommendation: Regular as tolerated   Brief/Interim Summary: 65 y.o.femalewith hx of obesity who had undergone Tx with HCG injections previously, hx of pernicious anemia with positive antibody for intrinsic factor, on montly B12 injection, thrombocytosis with platelet count over 1 million, started on Hydrea exactly one month ago (Dr Whitney Muse), hx of chronic pain on legs, back, arms, improved with one week of steroids, underwent epidural injections, accidentally took a few more pills of Hydrea, presented to the ER with nausea and vomiting. She was given antiemetics. Work up included a oxygen sat of 88 percent, and resulted in a negative CT angiogram, but incidentally showed bronchiectasis and atelectasis. Hospitalist was then asked to admit her for possible pneurmonia, but she was not coughing, having SOB, and had no leukocytosis, but she did have a low grade temp of 100 F. She had sed rate of 28, CRP 2.5, and negative rhematoid factors. She hadn't been able to eat well, and likely had clinical volume depletion.  1. Nausea vomiting, abdominal pain 1. Remains unclear etiology 2. CT chest personally reviewed. Stool was seen in the upper quadrants on that study. 3. Follow-up abdominal x-ray ordered with findings of stool in the hepatic flexure/descending colon, sigmoid colon, possible in the transverse colon 4. Patient reported no bowel movement over the past 2 days prior to admission 5. Patient responded well to MiraLAX as well as Colace. 6. After further questioning, also suspect underlying anxiety as achieving to patient's presenting gastrointestinal  symptoms (see below) 2. Pernicious anemia 1. Followed by hematology, on Hydrea 2. Low counts have remained stable this hospital admission 3. Follow-up with hematology as scheduled 3. Thrombocytosis 1. Blood counts have remained stable 4. Chronic pain 1. On narcotics per home regimen, however patient claims she is not taking Norco regularly. 5. Headache 1. Patient reported neck pains, improved with use of pillow from home 6. Depression/Anxiety 1. Family reports patient does have significant history of anxiety, panic attacks, periods of depression. There seem to be multiple life stressors including family members who have committed suicide as well as multiple deaths which occurred in the same timeframe. While discussing patient's life stressors, patient became quite tearful. Patient denies suicidal or homicidal ideations 2. Have strong recommended follow-up with therapist/psychotherapist to address unresolved psychological trauma  Discharge Diagnoses:  Principal Problem:   Nausea & vomiting Active Problems:   Pernicious anemia   Thrombocytosis (HCC)   Chronic pain disorder   Nausea and vomiting    Discharge Instructions     Medication List    STOP taking these medications   ciprofloxacin 250 MG tablet Commonly known as:  CIPRO     TAKE these medications   aspirin EC 81 MG tablet Take 1 tablet (81 mg total) by mouth daily.   aspirin-acetaminophen-caffeine 250-250-65 MG tablet Commonly known as:  EXCEDRIN MIGRAINE Take 1-2 tablets by mouth every 6 (six) hours as needed. migraines   bifidobacterium infantis capsule Take 1 capsule by mouth daily.   clonazePAM 1 MG tablet Commonly known as:  KLONOPIN Take 1 mg by mouth at bedtime. For sleep   docusate sodium 100 MG capsule Commonly known as:  COLACE Take 1 capsule (100 mg total) by mouth 2 (two)  times daily.   ezetimibe 10 MG tablet Commonly known as:  ZETIA Take 10 mg by mouth at bedtime.   fenofibrate 48 MG  tablet Commonly known as:  TRICOR Take 48 mg by mouth at bedtime.   gabapentin 300 MG capsule Commonly known as:  NEURONTIN Take 600-900 mg by mouth 3 (three) times daily. 600 mg in the morning and afternoon, then 900 mg at bedtime.   HYDROcodone-acetaminophen 5-325 MG tablet Commonly known as:  NORCO/VICODIN Take 2 tablets by mouth every 4 (four) hours as needed. What changed:  reasons to take this   hydroxyurea 500 MG capsule Commonly known as:  HYDREA Take 2 capsules (1,000 mg total) by mouth daily. May take with food to minimize GI side effects.   iron polysaccharides 150 MG capsule Commonly known as:  NIFEREX Take 1 capsule (150 mg total) by mouth daily.   ondansetron 8 MG tablet Commonly known as:  ZOFRAN Take 1 tablet (8 mg total) by mouth every 8 (eight) hours as needed for nausea or vomiting.   polyethylene glycol packet Commonly known as:  MIRALAX / GLYCOLAX Take 17 g by mouth daily. Start taking on:  08/23/2016      Follow-up Information    Wende Neighbors, MD. Schedule an appointment as soon as possible for a visit in 1 week(s).   Specialty:  Internal Medicine Contact information: [place] [address] [phone]          Allergies  Allergen Reactions  . Morphine Shortness Of Breath    Pt believes she was given to much    Procedures/Studies: Dg Chest 2 View  Result Date: 08/21/2016 CLINICAL DATA:  Generalized chest pain and weakness. Nausea and vomiting since yesterday. EXAM: CHEST  2 VIEW COMPARISON:  07/15/2012 FINDINGS: The heart is top-normal in size. Aortic atherosclerosis without aneurysm. Bibasilar atelectasis. No overt pulmonary edema. No pneumonic consolidations. There are left third through seventh rib fractures which are subacute to chronic in appearance. No pneumothorax. No effusion. IMPRESSION: Bibasilar atelectasis. Aortic atherosclerosis. No pneumonic consolidation or pneumothorax. Left third through seventh rib  fractures, subacute to chronic in appearance. Electronically Signed   By: Ashley Royalty M.D.   On: 08/21/2016 01:05   Ct Angio Chest Pe W And/or Wo Contrast  Result Date: 08/21/2016 CLINICAL DATA:  Shortness of breath and dizziness. Nausea and vomiting for 2 days. History of epidural 2 weeks ago. Pernicious anemia and thrombocytosis. No leukocytosis. EXAM: CT ANGIOGRAPHY CHEST WITH CONTRAST TECHNIQUE: Multidetector CT imaging of the chest was performed using the standard protocol during bolus administration of intravenous contrast. Multiplanar CT image reconstructions and MIPs were obtained to evaluate the vascular anatomy. CONTRAST:  100 cc IV Isovue 370 COMPARISON:  Same day chest radiograph. FINDINGS: Cardiovascular: Top normal-sized cardiac silhouette with coronary arteriosclerosis. Trace posterior pericardial effusion. No aortic aneurysm or dissection. No acute pulmonary embolus. Mediastinum/Nodes: There are mildly enlarged lymph nodes within the mediastinum and right hila, the largest is 11 mm short axis along the right paratracheal portion of the mediastinum, 7 mm short axis prevascular and 15 mm cardiophrenic. Soft tissue fullness of the right hilum consistent with hilar lymphadenopathy as well measuring up to 15 mm. No endoluminal abnormality within the visualized trachea and proximal bronchi. Lungs/Pleura: There is bronchiectasis to both lower lobes with atelectasis left greater than right. No effusion or pneumothorax. Upper Abdomen: No acute abnormality. Musculoskeletal: No chest wall abnormality. No acute or significant osseous findings. Review of the MIP images confirms the  above findings. IMPRESSION: No acute pulmonary embolus. Bibasilar atelectasis left greater than right with bronchiectasis. Mildly enlarged mediastinal and right hilar lymph nodes possibly reactive in etiology. Electronically Signed   By: Ashley Royalty M.D.   On: 08/21/2016 02:38   Ct Biopsy  Result Date: 08/04/2016 CLINICAL  DATA:  Anemia, thrombocytosis and diffuse pain syndrome. EXAM: CT GUIDED BONE MARROW ASPIRATION AND BIOPSY ANESTHESIA/SEDATION: Versed 1.5 mg IV, Fentanyl 50 mcg IV Total Moderate Sedation Time:  14 minutes. The patient's level of consciousness and physiologic status were continuously monitored during the procedure by Radiology nursing. PROCEDURE: The procedure risks, benefits, and alternatives were explained to the patient. Questions regarding the procedure were encouraged and answered. The patient understands and consents to the procedure. A time out was performed prior to initiating the procedure. The right gluteal region was prepped with chlorhexidine. Sterile gown and sterile gloves were used for the procedure. Local anesthesia was provided with 1% Lidocaine. Under CT guidance, an 11 gauge On Control bone cutting needle was advanced from a posterior approach into the right iliac bone. Needle positioning was confirmed with CT. Initial non heparinized and heparinized aspirate samples were obtained of bone marrow. Core biopsy was performed via the On Control drill needle. COMPLICATIONS: None FINDINGS: Inspection of initial aspirate did reveal visible particles. Intact core biopsy sample was obtained. IMPRESSION: CT guided bone marrow biopsy of right posterior iliac bone with both aspirate and core samples obtained. Electronically Signed   By: Aletta Edouard M.D.   On: 08/04/2016 14:28   Dg Abd Portable 1v  Result Date: 08/21/2016 CLINICAL DATA:  Nausea.  Constipation. EXAM: PORTABLE ABDOMEN - 1 VIEW COMPARISON:  None. FINDINGS: Normal bowel gas pattern.  No significant increase in colonic stool. No evidence of renal or ureteral stones. Soft tissues are unremarkable. Mild levoscoliosis of the lumbar spine with disc degenerative change most evident at L3-L4. No bone lesions. IMPRESSION: 1. No acute findings.  No evidence of bowel obstruction. 2. No radiographic evidence of increased colonic stool.  Electronically Signed   By: Lajean Manes M.D.   On: 08/21/2016 10:46    Subjective: No complaints today. Patient reports feeling much improved after moving her bowels and with Colace  Discharge Exam: Vitals:   08/21/16 1932 08/22/16 0519  BP: (!) 145/69 140/66  Pulse: 67 72  Resp: 20 20  Temp: 97.9 F (36.6 C) 97.9 F (36.6 C)   Vitals:   08/21/16 0719 08/21/16 1350 08/21/16 1932 08/22/16 0519  BP: 119/62 120/64 (!) 145/69 140/66  Pulse: 83 81 67 72  Resp: _0 Temp: 97.5 F (36.4 C) 98.2 F (36.8 C) 97.9 F (36.6 C) 97.9 F (36.6 C)  TempSrc: Oral Oral Oral Oral  SpO2: 97% 98% 98% 96%  Weight: 63 kg (139 lb)     Height: _1  (1.702 m)       General: Pt is alert, awake, not in acute distress Cardiovascular: RRR, S1/S2 +, no rubs, no gallops Respiratory: CTA bilaterally, no wheezing, no rhonchi Abdominal: Soft, NT, ND, bowel sounds + Extremities: no edema, no cyanosis   The results of significant diagnostics from this hospitalization (including imaging, microbiology, ancillary and laboratory) are listed below for reference.     Microbiology: No results found for this or any previous visit (from the past 240 hour(s)).   Labs: BNP (last 3 results) No results for input(s): BNP in the last 8760 hours. Basic Metabolic Panel:  Recent Labs Lab 08/20/16 2122 08/22/16 0604  NA 132* 141  K 3.8 3.9  CL 99* 110  CO2 23 25  GLUCOSE 198* 134*  BUN 19 7  CREATININE 0.71 0.59  CALCIUM 9.0 8.6*   Liver Function Tests:  Recent Labs Lab 08/20/16 2122 08/22/16 0604  AST 64* 36  ALT 68* 50  ALKPHOS 89 68  BILITOT 1.1 0.4  PROT 7.2 6.1*  ALBUMIN 3.5 2.9*    Recent Labs Lab 08/20/16 2122  LIPASE 20   No results for input(s): AMMONIA in the last 168 hours. CBC:  Recent Labs Lab 08/18/16 1308 08/20/16 2122 08/22/16 0604  WBC 7.1 6.7 3.2*  NEUTROABS 4.4 5.7  --   HGB 12.8 13.1 11.0*  HCT 39.7 39.9 34.9*  MCV 90.4 89.5 91.4  PLT 789* 615*  553*   Cardiac Enzymes:  Recent Labs Lab 08/21/16 0924  CKTOTAL 22*   BNP: Invalid input(s): POCBNP CBG: No results for input(s): GLUCAP in the last 168 hours. D-Dimer  Recent Labs  08/20/16 2122  DDIMER 4.55*   Hgb A1c No results for input(s): HGBA1C in the last 72 hours. Lipid Profile No results for input(s): CHOL, HDL, LDLCALC, TRIG, CHOLHDL, LDLDIRECT in the last 72 hours. Thyroid function studies  Recent Labs  08/21/16 0924  TSH 0.419   Anemia work up  Recent Labs  08/21/16 0924  VITAMINB12 608   Urinalysis    Component Value Date/Time   COLORURINE YELLOW 08/21/2016 0404   APPEARANCEUR CLEAR 08/21/2016 0404   LABSPEC 1.010 08/21/2016 0404   PHURINE 6.0 08/21/2016 0404   GLUCOSEU NEGATIVE 08/21/2016 0404   HGBUR MODERATE (A) 08/21/2016 0404   BILIRUBINUR NEGATIVE 08/21/2016 0404   KETONESUR NEGATIVE 08/21/2016 0404   PROTEINUR NEGATIVE 08/21/2016 0404   UROBILINOGEN 0.2 02/14/2012 0834   NITRITE NEGATIVE 08/21/2016 0404   LEUKOCYTESUR NEGATIVE 08/21/2016 0404   Sepsis Labs Invalid input(s): PROCALCITONIN,  WBC,  LACTICIDVEN Microbiology No results found for this or any previous visit (from the past 240 hour(s)).   SIGNED:   Donne Hazel, MD  [facility] 08/22/2016, 5:34 PM  If 7PM-7AM, please contact night-coverage [email or web address] Password [email or web address]

## 2016-08-23 LAB — VITAMIN B1: Vitamin B1 (Thiamine): 112.3 nmol/L (ref 66.5–200.0)

## 2016-08-24 LAB — ANTINUCLEAR ANTIBODIES, IFA: ANTINUCLEAR ANTIBODIES, IFA: POSITIVE — AB

## 2016-08-24 LAB — FANA STAINING PATTERNS: Homogeneous Pattern: 1:160 {titer} — ABNORMAL HIGH

## 2016-08-24 LAB — FOLATE RBC
Folate, Hemolysate: 335.9 ng/mL
Folate, RBC: 1040 ng/mL (ref >498)
Hematocrit: 32.3 % — ABNORMAL LOW (ref 34.0–46.6)

## 2016-08-30 DIAGNOSIS — R7989 Other specified abnormal findings of blood chemistry: Secondary | ICD-10-CM | POA: Diagnosis not present

## 2016-08-30 DIAGNOSIS — R0602 Shortness of breath: Secondary | ICD-10-CM | POA: Diagnosis not present

## 2016-08-30 DIAGNOSIS — R1011 Right upper quadrant pain: Secondary | ICD-10-CM | POA: Diagnosis not present

## 2016-08-30 DIAGNOSIS — R5381 Other malaise: Secondary | ICD-10-CM | POA: Diagnosis not present

## 2016-08-30 DIAGNOSIS — M797 Fibromyalgia: Secondary | ICD-10-CM | POA: Diagnosis not present

## 2016-08-30 DIAGNOSIS — R531 Weakness: Secondary | ICD-10-CM | POA: Diagnosis not present

## 2016-08-30 DIAGNOSIS — R945 Abnormal results of liver function studies: Secondary | ICD-10-CM | POA: Diagnosis not present

## 2016-08-31 ENCOUNTER — Ambulatory Visit (HOSPITAL_COMMUNITY): Payer: Medicare Other

## 2016-09-01 ENCOUNTER — Other Ambulatory Visit (HOSPITAL_COMMUNITY): Payer: Medicare Other

## 2016-09-01 ENCOUNTER — Ambulatory Visit (HOSPITAL_COMMUNITY): Payer: Medicare Other | Admitting: Oncology

## 2016-09-01 DIAGNOSIS — D473 Essential (hemorrhagic) thrombocythemia: Secondary | ICD-10-CM | POA: Diagnosis not present

## 2016-09-01 DIAGNOSIS — D510 Vitamin B12 deficiency anemia due to intrinsic factor deficiency: Secondary | ICD-10-CM | POA: Diagnosis not present

## 2016-09-05 ENCOUNTER — Encounter (HOSPITAL_COMMUNITY): Payer: Self-pay | Admitting: Hematology & Oncology

## 2016-09-14 DIAGNOSIS — D473 Essential (hemorrhagic) thrombocythemia: Secondary | ICD-10-CM | POA: Diagnosis not present

## 2016-09-14 DIAGNOSIS — D511 Vitamin B12 deficiency anemia due to selective vitamin B12 malabsorption with proteinuria: Secondary | ICD-10-CM | POA: Diagnosis not present

## 2016-09-14 DIAGNOSIS — D510 Vitamin B12 deficiency anemia due to intrinsic factor deficiency: Secondary | ICD-10-CM | POA: Diagnosis not present

## 2016-09-15 DIAGNOSIS — D473 Essential (hemorrhagic) thrombocythemia: Secondary | ICD-10-CM | POA: Diagnosis not present

## 2016-09-15 DIAGNOSIS — D510 Vitamin B12 deficiency anemia due to intrinsic factor deficiency: Secondary | ICD-10-CM | POA: Diagnosis not present

## 2016-09-21 DIAGNOSIS — D519 Vitamin B12 deficiency anemia, unspecified: Secondary | ICD-10-CM | POA: Diagnosis not present

## 2016-09-21 DIAGNOSIS — D473 Essential (hemorrhagic) thrombocythemia: Secondary | ICD-10-CM | POA: Diagnosis not present

## 2016-09-21 DIAGNOSIS — D510 Vitamin B12 deficiency anemia due to intrinsic factor deficiency: Secondary | ICD-10-CM | POA: Diagnosis not present

## 2016-09-25 DIAGNOSIS — D510 Vitamin B12 deficiency anemia due to intrinsic factor deficiency: Secondary | ICD-10-CM | POA: Diagnosis not present

## 2016-09-25 DIAGNOSIS — D473 Essential (hemorrhagic) thrombocythemia: Secondary | ICD-10-CM | POA: Diagnosis not present

## 2016-10-02 DIAGNOSIS — R202 Paresthesia of skin: Secondary | ICD-10-CM | POA: Diagnosis not present

## 2016-10-07 ENCOUNTER — Ambulatory Visit (HOSPITAL_COMMUNITY): Payer: Medicare Other

## 2016-10-08 DIAGNOSIS — G2581 Restless legs syndrome: Secondary | ICD-10-CM | POA: Diagnosis not present

## 2016-10-08 DIAGNOSIS — E782 Mixed hyperlipidemia: Secondary | ICD-10-CM | POA: Diagnosis not present

## 2016-10-08 DIAGNOSIS — R600 Localized edema: Secondary | ICD-10-CM | POA: Diagnosis not present

## 2016-10-08 DIAGNOSIS — M5431 Sciatica, right side: Secondary | ICD-10-CM | POA: Diagnosis not present

## 2016-10-08 DIAGNOSIS — F411 Generalized anxiety disorder: Secondary | ICD-10-CM | POA: Diagnosis not present

## 2016-10-08 DIAGNOSIS — R7301 Impaired fasting glucose: Secondary | ICD-10-CM | POA: Diagnosis not present

## 2016-10-08 DIAGNOSIS — D696 Thrombocytopenia, unspecified: Secondary | ICD-10-CM | POA: Diagnosis not present

## 2016-10-14 DIAGNOSIS — D510 Vitamin B12 deficiency anemia due to intrinsic factor deficiency: Secondary | ICD-10-CM | POA: Diagnosis not present

## 2016-10-14 DIAGNOSIS — D473 Essential (hemorrhagic) thrombocythemia: Secondary | ICD-10-CM | POA: Diagnosis not present

## 2016-10-19 DIAGNOSIS — M5412 Radiculopathy, cervical region: Secondary | ICD-10-CM | POA: Diagnosis not present

## 2016-10-21 DIAGNOSIS — D510 Vitamin B12 deficiency anemia due to intrinsic factor deficiency: Secondary | ICD-10-CM | POA: Diagnosis not present

## 2016-10-21 DIAGNOSIS — D473 Essential (hemorrhagic) thrombocythemia: Secondary | ICD-10-CM | POA: Diagnosis not present

## 2016-10-28 DIAGNOSIS — D473 Essential (hemorrhagic) thrombocythemia: Secondary | ICD-10-CM | POA: Diagnosis not present

## 2016-10-28 DIAGNOSIS — D510 Vitamin B12 deficiency anemia due to intrinsic factor deficiency: Secondary | ICD-10-CM | POA: Diagnosis not present

## 2016-11-04 DIAGNOSIS — M542 Cervicalgia: Secondary | ICD-10-CM | POA: Diagnosis not present

## 2016-11-11 DIAGNOSIS — D473 Essential (hemorrhagic) thrombocythemia: Secondary | ICD-10-CM | POA: Diagnosis not present

## 2016-11-12 DIAGNOSIS — D473 Essential (hemorrhagic) thrombocythemia: Secondary | ICD-10-CM | POA: Diagnosis not present

## 2016-11-12 DIAGNOSIS — D510 Vitamin B12 deficiency anemia due to intrinsic factor deficiency: Secondary | ICD-10-CM | POA: Diagnosis not present

## 2016-11-16 DIAGNOSIS — M79606 Pain in leg, unspecified: Secondary | ICD-10-CM | POA: Diagnosis not present

## 2016-11-16 DIAGNOSIS — F411 Generalized anxiety disorder: Secondary | ICD-10-CM | POA: Diagnosis not present

## 2016-11-16 DIAGNOSIS — F339 Major depressive disorder, recurrent, unspecified: Secondary | ICD-10-CM | POA: Diagnosis not present

## 2016-11-16 DIAGNOSIS — F5101 Primary insomnia: Secondary | ICD-10-CM | POA: Diagnosis not present

## 2016-11-16 DIAGNOSIS — K219 Gastro-esophageal reflux disease without esophagitis: Secondary | ICD-10-CM | POA: Diagnosis not present

## 2016-11-16 DIAGNOSIS — D696 Thrombocytopenia, unspecified: Secondary | ICD-10-CM | POA: Diagnosis not present

## 2016-11-16 DIAGNOSIS — M542 Cervicalgia: Secondary | ICD-10-CM | POA: Diagnosis not present

## 2016-11-16 DIAGNOSIS — M25519 Pain in unspecified shoulder: Secondary | ICD-10-CM | POA: Diagnosis not present

## 2016-11-24 DIAGNOSIS — M7541 Impingement syndrome of right shoulder: Secondary | ICD-10-CM | POA: Diagnosis not present

## 2016-11-24 DIAGNOSIS — M542 Cervicalgia: Secondary | ICD-10-CM | POA: Diagnosis not present

## 2016-11-24 DIAGNOSIS — M7542 Impingement syndrome of left shoulder: Secondary | ICD-10-CM | POA: Diagnosis not present

## 2016-11-25 ENCOUNTER — Ambulatory Visit (HOSPITAL_COMMUNITY): Payer: Medicare Other

## 2017-01-06 DIAGNOSIS — D473 Essential (hemorrhagic) thrombocythemia: Secondary | ICD-10-CM | POA: Diagnosis not present

## 2017-01-06 DIAGNOSIS — D510 Vitamin B12 deficiency anemia due to intrinsic factor deficiency: Secondary | ICD-10-CM | POA: Diagnosis not present

## 2017-01-25 ENCOUNTER — Ambulatory Visit (HOSPITAL_COMMUNITY)
Admission: RE | Admit: 2017-01-25 | Discharge: 2017-01-25 | Disposition: A | Discharge status: Home / Self Care | Payer: Medicare Other | Source: Ambulatory Visit | Attending: Internal Medicine | Admitting: Internal Medicine

## 2017-01-25 DIAGNOSIS — Z1231 Encounter for screening mammogram for malignant neoplasm of breast: Secondary | ICD-10-CM | POA: Diagnosis not present

## 2017-01-25 DIAGNOSIS — E782 Mixed hyperlipidemia: Secondary | ICD-10-CM | POA: Diagnosis not present

## 2017-01-25 DIAGNOSIS — R7301 Impaired fasting glucose: Secondary | ICD-10-CM | POA: Diagnosis not present

## 2017-01-27 DIAGNOSIS — M545 Low back pain: Secondary | ICD-10-CM | POA: Diagnosis not present

## 2017-01-29 DIAGNOSIS — F411 Generalized anxiety disorder: Secondary | ICD-10-CM | POA: Diagnosis not present

## 2017-01-29 DIAGNOSIS — R7301 Impaired fasting glucose: Secondary | ICD-10-CM | POA: Diagnosis not present

## 2017-01-29 DIAGNOSIS — E782 Mixed hyperlipidemia: Secondary | ICD-10-CM | POA: Diagnosis not present

## 2017-01-29 DIAGNOSIS — Z0000 Encounter for general adult medical examination without abnormal findings: Secondary | ICD-10-CM | POA: Diagnosis not present

## 2017-01-29 DIAGNOSIS — D696 Thrombocytopenia, unspecified: Secondary | ICD-10-CM | POA: Diagnosis not present

## 2017-02-01 ENCOUNTER — Other Ambulatory Visit (HOSPITAL_COMMUNITY): Payer: Self-pay | Admitting: Internal Medicine

## 2017-02-01 DIAGNOSIS — Z780 Asymptomatic menopausal state: Secondary | ICD-10-CM

## 2017-02-10 ENCOUNTER — Other Ambulatory Visit (HOSPITAL_COMMUNITY): Payer: Medicare Other

## 2017-02-22 ENCOUNTER — Encounter (HOSPITAL_COMMUNITY): Payer: Self-pay

## 2017-02-22 ENCOUNTER — Other Ambulatory Visit (HOSPITAL_COMMUNITY): Payer: Medicare Other

## 2017-02-23 ENCOUNTER — Ambulatory Visit: Payer: Medicare Other | Admitting: General Surgery

## 2017-02-23 DIAGNOSIS — M461 Sacroiliitis, not elsewhere classified: Secondary | ICD-10-CM | POA: Diagnosis not present

## 2017-02-23 DIAGNOSIS — M545 Low back pain: Secondary | ICD-10-CM | POA: Diagnosis not present

## 2017-02-24 DIAGNOSIS — K219 Gastro-esophageal reflux disease without esophagitis: Secondary | ICD-10-CM | POA: Diagnosis not present

## 2017-02-24 DIAGNOSIS — K2970 Gastritis, unspecified, without bleeding: Secondary | ICD-10-CM | POA: Diagnosis not present

## 2017-02-24 DIAGNOSIS — Z6823 Body mass index (BMI) 23.0-23.9, adult: Secondary | ICD-10-CM | POA: Diagnosis not present

## 2017-04-06 ENCOUNTER — Ambulatory Visit: Payer: Medicare Other | Admitting: General Surgery

## 2017-04-06 DIAGNOSIS — D510 Vitamin B12 deficiency anemia due to intrinsic factor deficiency: Secondary | ICD-10-CM | POA: Diagnosis not present

## 2017-04-06 DIAGNOSIS — D473 Essential (hemorrhagic) thrombocythemia: Secondary | ICD-10-CM | POA: Diagnosis not present

## 2017-04-13 ENCOUNTER — Ambulatory Visit (INDEPENDENT_AMBULATORY_CARE_PROVIDER_SITE_OTHER): Payer: Medicare Other | Admitting: General Surgery

## 2017-04-13 ENCOUNTER — Encounter: Payer: Self-pay | Admitting: General Surgery

## 2017-04-13 VITALS — BP 174/95 | HR 76 | Temp 100.0°F | Resp 18 | Ht 66.0 in | Wt 141.0 lb

## 2017-04-13 DIAGNOSIS — Z1211 Encounter for screening for malignant neoplasm of colon: Secondary | ICD-10-CM

## 2017-04-13 MED ORDER — PEG 3350-KCL-NABCB-NACL-NASULF 236 G PO SOLR: 4000.0000 mL | Freq: Once | ORAL | 0 refills | Status: AC

## 2017-04-13 NOTE — Patient Instructions (Signed)
Polyethylene Glycol; Electrolytes oral solution What is this medicine? POLYETHYLENE GLYCOL; ELECTROLYTES (pol ee ETH i leen GLYE col; i LEK truh lahyts) is a laxative. It is used to clean out the bowel before a colonoscopy. This medicine may be used for other purposes; ask your health care provider or pharmacist if you have questions. COMMON BRAND NAME(S): Colyte, GaviLyte-C, GaviLyte-G, GaviLyte-N, GoLYTELY, NuLYTELY, OCL, Suclear, TriLyte What should I tell my health care provider before I take this medicine? They need to know if you have any of these conditions: -any chronic disease of the intestine, stomach, or throat -bloating or pain in stomach area -dehydration -difficulty swallowing -heart disease -kidney disease -low levels of sodium in the blood -seizures -an unusual or allergic reaction to polyethylene glycol, other medicines, dyes, or preservatives -pregnant or trying to get pregnant -breast-feeding How should I use this medicine? Take this medicine by mouth. Before using this medicine you or your pharmacist must fill the container with the amount of water indicated on the package label. Chill solution before using to improve taste. Shake well before each dose. Your doctor will tell you what time to start this medicine. Take exactly as directed. You will usually have the first bowel movement about 1 hour after you begin drinking the solution. After that, you will have frequent watery bowel movements. You will need to follow a special diet before your procedure. Talk to your doctor. Do not eat any solid foods for 3 to 4 hours before taking this medicine. A special MedGuide will be given to you by the pharmacist with each prescription and refill. Be sure to read this information carefully each time. Talk to your pediatrician regarding the use of this medicine in children. Special care may be needed. Overdosage: If you think you have taken too much of this medicine contact a poison  control center or emergency room at once. NOTE: This medicine is only for you. Do not share this medicine with others. What if I miss a dose? You should talk to your doctor if you are not able to complete the bowel preparation as advised. What may interact with this medicine? Do not take any other medicine by mouth starting one hour before you take this medicine. Talk to your doctor about when to take your other medicines. This medicine may interact with the following medications: -certain medicines for blood pressure, heart disease, irregular heart beat -certain medicines for kidney disease -certain medicines for seizures like carbamazepine, phenobarbital, phenytoin -diuretics -laxatives -NSAIDS, medicines for pain and inflammation, like ibuprofen or naproxen This list may not describe all possible interactions. Give your health care provider a list of all the medicines, herbs, non-prescription drugs, or dietary supplements you use. Also tell them if you smoke, drink alcohol, or use illegal drugs. Some items may interact with your medicine. What should I watch for while using this medicine? Tell your doctor if you experience any changes in bowel habits that are severe or last longer than three weeks. Do not inhale dust from the solution powder. This can make breathing difficult or may cause sneezing or an allergic-type reaction. Bloating may occur before the first bowel movement. If your discomfort continues while you are drinking the solution, stop drinking the solution temporarily or drink each portion with longer spaces in between until your symptoms disappear. Contact your doctor or health care professional if you have any concerns. What side effects may I notice from receiving this medicine? Side effects that you should report to your doctor  or health care professional as soon as possible: -allergic reactions like skin rash, itching or hives, swelling of the face, lips, or  tongue -breathing problems -chest pain -fast, irregular heartbeat -seizures -trouble passing urine or change in the amount of urine -vomiting Side effects that usually do not require medical attention (report to your doctor or health care professional if they continue or are bothersome): -anal irritation -bloating, pain, or distension of the stomach -headache -hunger, thirst -nausea -stomach gas This list may not describe all possible side effects. Call your doctor for medical advice about side effects. You may report side effects to FDA at 1-800-FDA-1088. Where should I keep my medicine? Keep out of the reach of children. Store the solution in the refrigerator to improve the taste. Do not freeze. Throw away any unused medicine 48 hours after mixing. NOTE: This sheet is a summary. It may not cover all possible information. If you have questions about this medicine, talk to your doctor, pharmacist, or health care provider.  2018 Elsevier/Gold Standard (2015-10-31 13:13:26) Colonoscopy, Adult A colonoscopy is an exam to look at the entire large intestine. During the exam, a lubricated, bendable tube is inserted into the anus and then passed into the rectum, colon, and other parts of the large intestine. A colonoscopy is often done as a part of normal colorectal screening or in response to certain symptoms, such as anemia, persistent diarrhea, abdominal pain, and blood in the stool. The exam can help screen for and diagnose medical problems, including:  Tumors.  Polyps.  Inflammation.  Areas of bleeding.  Tell a health care provider about:  Any allergies you have.  All medicines you are taking, including vitamins, herbs, eye drops, creams, and over-the-counter medicines.  Any problems you or family members have had with anesthetic medicines.  Any blood disorders you have.  Any surgeries you have had.  Any medical conditions you have.  Any problems you have had passing  stool. What are the risks? Generally, this is a safe procedure. However, problems may occur, including:  Bleeding.  A tear in the intestine.  A reaction to medicines given during the exam.  Infection (rare).  What happens before the procedure? Eating and drinking restrictions Follow instructions from your health care provider about eating and drinking, which may include:  A few days before the procedure - follow a low-fiber diet. Avoid nuts, seeds, dried fruit, raw fruits, and vegetables.  1-3 days before the procedure - follow a clear liquid diet. Drink only clear liquids, such as clear broth or bouillon, black coffee or tea, clear juice, clear soft drinks or sports drinks, gelatin dessert, and popsicles. Avoid any liquids that contain red or purple dye.  On the day of the procedure - do not eat or drink anything during the 2 hours before the procedure, or within the time period that your health care provider recommends.  Bowel prep If you were prescribed an oral bowel prep to clean out your colon:  Take it as told by your health care provider. Starting the day before your procedure, you will need to drink a large amount of medicated liquid. The liquid will cause you to have multiple loose stools until your stool is almost clear or light green.  If your skin or anus gets irritated from diarrhea, you may use these to relieve the irritation: ? Medicated wipes, such as adult wet wipes with aloe and vitamin E. ? A skin soothing-product like petroleum jelly.  If you vomit while drinking  the bowel prep, take a break for up to 60 minutes and then begin the bowel prep again. If vomiting continues and you cannot take the bowel prep without vomiting, call your health care provider.  General instructions  Ask your health care provider about changing or stopping your regular medicines. This is especially important if you are taking diabetes medicines or blood thinners.  Plan to have someone  take you home from the hospital or clinic. What happens during the procedure?  An IV tube may be inserted into one of your veins.  You will be given medicine to help you relax (sedative).  To reduce your risk of infection: ? Your health care team will wash or sanitize their hands. ? Your anal area will be washed with soap.  You will be asked to lie on your side with your knees bent.  Your health care provider will lubricate a long, thin, flexible tube. The tube will have a camera and a light on the end.  The tube will be inserted into your anus.  The tube will be gently eased through your rectum and colon.  Air will be delivered into your colon to keep it open. You may feel some pressure or cramping.  The camera will be used to take images during the procedure.  A small tissue sample may be removed from your body to be examined under a microscope (biopsy). If any potential problems are found, the tissue will be sent to a lab for testing.  If small polyps are found, your health care provider may remove them and have them checked for cancer cells.  The tube that was inserted into your anus will be slowly removed. The procedure may vary among health care providers and hospitals. What happens after the procedure?  Your blood pressure, heart rate, breathing rate, and blood oxygen level will be monitored until the medicines you were given have worn off.  Do not drive for 24 hours after the exam.  You may have a small amount of blood in your stool.  You may pass gas and have mild abdominal cramping or bloating due to the air that was used to inflate your colon during the exam.  It is up to you to get the results of your procedure. Ask your health care provider, or the department performing the procedure, when your results will be ready. This information is not intended to replace advice given to you by your health care provider. Make sure you discuss any questions you have with your  health care provider. Document Released: 09/25/2000 Document Revised: 07/29/2016 Document Reviewed: 12/10/2015 Elsevier Interactive Patient Education  2018 [employer].

## 2017-04-14 NOTE — Progress Notes (Signed)
Colleen Nash; [id number]; [birth year]   HPI Patient is a 66 year old white female who was referred to my care by Dr. Nevada Crane for a screening colonoscopy. Patient states she has not had a colonoscopy for over 10 years. She denies any blood per rectum, abdominal pain, abnormal diarrhea or constipation, or family history of colon cancer. She has no pain at the present time. Past Medical History:  Diagnosis Date  . Anxiety   . Back pain   . Hypercholesteremia   . Panic attacks   . Pernicious anemia 07/08/2016  . Restless leg   . Thrombocytosis ([place]) 07/10/2016  . Urinary tract infection     Past Surgical History:  Procedure Laterality Date  . CESAREAN SECTION    . CHOLECYSTECTOMY    . TUBAL LIGATION      Family History  Problem Relation Age of Onset  . Heart failure Mother   . Heart disease Mother   . Cancer Father   . Heart disease Father   . Hyperlipidemia Sister   . Hypertension Sister   . Heart disease Sister        before age 77  . Varicose Veins Sister   . Heart attack Sister   . AAA (abdominal aortic aneurysm) Sister   . Heart attack Sister     Current Outpatient Prescriptions on File Prior to Visit  Medication Sig Dispense Refill  . aspirin EC 81 MG tablet Take 1 tablet (81 mg total) by mouth daily. 30 tablet 3  . aspirin-acetaminophen-caffeine (EXCEDRIN MIGRAINE) 627-035-00 MG per tablet Take 1-2 tablets by mouth every 6 (six) hours as needed. migraines    . bifidobacterium infantis (ALIGN) capsule Take 1 capsule by mouth daily.    . clonazePAM (KLONOPIN) 1 MG tablet Take 1 mg by mouth at bedtime. For sleep    . docusate sodium (COLACE) 100 MG capsule Take 1 capsule (100 mg total) by mouth 2 (two) times daily. 10 capsule 0  . ezetimibe (ZETIA) 10 MG tablet Take 10 mg by mouth at bedtime.     . fenofibrate (TRICOR) 48 MG tablet Take 48 mg by mouth at bedtime.     . gabapentin (NEURONTIN) 300 MG capsule Take 600-900 mg by mouth 3 (three) times daily. 600 mg in the  morning and afternoon, then 900 mg at bedtime.    Marland Kitchen HYDROcodone-acetaminophen (NORCO/VICODIN) 5-325 MG tablet Take 2 tablets by mouth every 4 (four) hours as needed. (Patient taking differently: Take 2 tablets by mouth every 4 (four) hours as needed for moderate pain or severe pain. ) 20 tablet 0  . hydroxyurea (HYDREA) 500 MG capsule Take 2 capsules (1,000 mg total) by mouth daily. May take with food to minimize GI side effects. 60 capsule 1  . iron polysaccharides (NIFEREX) 150 MG capsule Take 1 capsule (150 mg total) by mouth daily. 30 capsule 3  . ondansetron (ZOFRAN) 8 MG tablet Take 1 tablet (8 mg total) by mouth every 8 (eight) hours as needed for nausea or vomiting. (Patient not taking: Reported on 08/21/2016) 30 tablet 1  . polyethylene glycol (MIRALAX / GLYCOLAX) packet Take 17 g by mouth daily. 14 each 0   No current facility-administered medications on file prior to visit.     Allergies  Allergen Reactions  . Morphine Shortness Of Breath    Pt believes she was given to much    History  Alcohol Use  . Yes    Comment: occ    History  Smoking Status  .  Never Smoker  Smokeless Tobacco  . Never Used    Review of Systems  Constitutional: Positive for malaise/fatigue.  HENT: Positive for sinus pain.   Eyes: Negative.   Respiratory: Negative.   Cardiovascular: Negative.   Gastrointestinal: Positive for heartburn.  Genitourinary: Negative.   Musculoskeletal: Positive for back pain and joint pain.  Skin: Negative.   Neurological: Negative.   Endo/Heme/Allergies: Negative.   Psychiatric/Behavioral: Negative.     Objective   Vitals:   04/13/17 1050  BP: (!) 174/95  Pulse: 76  Resp: 18  Temp: 100 F (37.8 C)    Physical Exam  Constitutional: She is oriented to person, place, and time and well-developed, well-nourished, and in no distress.  HENT:  Head: Normocephalic and atraumatic.  Neck: Normal range of motion. Neck supple.  Cardiovascular: Normal rate,  regular rhythm and normal heart sounds.   No murmur heard. Pulmonary/Chest: Effort normal and breath sounds normal. She has no wheezes. She has no rales.  Abdominal: Soft. Bowel sounds are normal. She exhibits no distension. There is no tenderness.  Neurological: She is alert and oriented to person, place, and time.  Skin: Skin is warm and dry.  Vitals reviewed.   Assessment  Need for screening colonoscopy Plan   Patient is scheduled for a screening colonoscopy on 04/20/2017. The risks and benefits of the procedure including bleeding and perforation were fully explained to the patient, who gave informed consent.

## 2017-04-14 NOTE — H&P (Signed)
Colleen Nash; [id number]; 08-May-1951   HPI Patient is a 66 year old white female who was referred to my care by Dr. Nevada Crane for a screening colonoscopy. Patient states she has not had a colonoscopy for over 10 years. She denies any blood per rectum, abdominal pain, abnormal diarrhea or constipation, or family history of colon cancer. She has no pain at the present time.     Past Medical History:  Diagnosis Date  . Anxiety   . Back pain   . Hypercholesteremia   . Panic attacks   . Pernicious anemia 07/08/2016  . Restless leg   . Thrombocytosis ([place]) 07/10/2016  . Urinary tract infection          Past Surgical History:  Procedure Laterality Date  . CESAREAN SECTION    . CHOLECYSTECTOMY    . TUBAL LIGATION           Family History  Problem Relation Age of Onset  . Heart failure Mother   . Heart disease Mother   . Cancer Father   . Heart disease Father   . Hyperlipidemia Sister   . Hypertension Sister   . Heart disease Sister        before age 70  . Varicose Veins Sister   . Heart attack Sister   . AAA (abdominal aortic aneurysm) Sister   . Heart attack Sister           Current Outpatient Prescriptions on File Prior to Visit  Medication Sig Dispense Refill  . aspirin EC 81 MG tablet Take 1 tablet (81 mg total) by mouth daily. 30 tablet 3  . aspirin-acetaminophen-caffeine (EXCEDRIN MIGRAINE) [id number] MG per tablet Take 1-2 tablets by mouth every 6 (six) hours as needed. migraines    . bifidobacterium infantis (ALIGN) capsule Take 1 capsule by mouth daily.    . clonazePAM (KLONOPIN) 1 MG tablet Take 1 mg by mouth at bedtime. For sleep    . docusate sodium (COLACE) 100 MG capsule Take 1 capsule (100 mg total) by mouth 2 (two) times daily. 10 capsule 0  . ezetimibe (ZETIA) 10 MG tablet Take 10 mg by mouth at bedtime.     . fenofibrate (TRICOR) 48 MG tablet Take 48 mg by mouth at bedtime.     . gabapentin (NEURONTIN) 300 MG capsule  Take 600-900 mg by mouth 3 (three) times daily. 600 mg in the morning and afternoon, then 900 mg at bedtime.    Marland Kitchen HYDROcodone-acetaminophen (NORCO/VICODIN) 5-325 MG tablet Take 2 tablets by mouth every 4 (four) hours as needed. (Patient taking differently: Take 2 tablets by mouth every 4 (four) hours as needed for moderate pain or severe pain. ) 20 tablet 0  . hydroxyurea (HYDREA) 500 MG capsule Take 2 capsules (1,000 mg total) by mouth daily. May take with food to minimize GI side effects. 60 capsule 1  . iron polysaccharides (NIFEREX) 150 MG capsule Take 1 capsule (150 mg total) by mouth daily. 30 capsule 3  . ondansetron (ZOFRAN) 8 MG tablet Take 1 tablet (8 mg total) by mouth every 8 (eight) hours as needed for nausea or vomiting. (Patient not taking: Reported on 08/21/2016) 30 tablet 1  . polyethylene glycol (MIRALAX / GLYCOLAX) packet Take 17 g by mouth daily. 14 each 0   No current facility-administered medications on file prior to visit.          Allergies  Allergen Reactions  . Morphine Shortness Of Breath    Pt believes she was given to  much        History  Alcohol Use  . Yes    Comment: occ       History  Smoking Status  . Never Smoker  Smokeless Tobacco  . Never Used    Review of Systems  Constitutional: Positive for malaise/fatigue.  HENT: Positive for sinus pain.   Eyes: Negative.   Respiratory: Negative.   Cardiovascular: Negative.   Gastrointestinal: Positive for heartburn.  Genitourinary: Negative.   Musculoskeletal: Positive for back pain and joint pain.  Skin: Negative.   Neurological: Negative.   Endo/Heme/Allergies: Negative.   Psychiatric/Behavioral: Negative.     Objective      Vitals:   04/13/17 1050  BP: (!) 174/95  Pulse: 76  Resp: 18  Temp: 100 F (37.8 C)    Physical Exam  Constitutional: She is oriented to person, place, and time and well-developed, well-nourished, and in no distress.  HENT:  Head:  Normocephalic and atraumatic.  Neck: Normal range of motion. Neck supple.  Cardiovascular: Normal rate, regular rhythm and normal heart sounds.   No murmur heard. Pulmonary/Chest: Effort normal and breath sounds normal. She has no wheezes. She has no rales.  Abdominal: Soft. Bowel sounds are normal. She exhibits no distension. There is no tenderness.  Neurological: She is alert and oriented to person, place, and time.  Skin: Skin is warm and dry.  Vitals reviewed.   Assessment  Need for screening colonoscopy Plan   Patient is scheduled for a screening colonoscopy on 04/20/2017. The risks and benefits of the procedure including bleeding and perforation were fully explained to the patient, who gave informed consent.

## 2017-04-20 ENCOUNTER — Encounter (HOSPITAL_COMMUNITY)
Admission: RE | Disposition: A | Discharge status: Home / Self Care | Payer: Self-pay | Source: Ambulatory Visit | Attending: General Surgery

## 2017-04-20 ENCOUNTER — Ambulatory Visit (HOSPITAL_COMMUNITY)
Admission: RE | Admit: 2017-04-20 | Discharge: 2017-04-20 | Disposition: A | Discharge status: Home / Self Care | Payer: Medicare Other | Source: Ambulatory Visit | Attending: General Surgery | Admitting: General Surgery

## 2017-04-20 ENCOUNTER — Encounter (HOSPITAL_COMMUNITY): Payer: Self-pay | Admitting: *Deleted

## 2017-04-20 DIAGNOSIS — Z1211 Encounter for screening for malignant neoplasm of colon: Secondary | ICD-10-CM | POA: Diagnosis not present

## 2017-04-20 DIAGNOSIS — Z885 Allergy status to narcotic agent status: Secondary | ICD-10-CM | POA: Insufficient documentation

## 2017-04-20 DIAGNOSIS — D122 Benign neoplasm of ascending colon: Secondary | ICD-10-CM

## 2017-04-20 DIAGNOSIS — E7800 Pure hypercholesterolemia, unspecified: Secondary | ICD-10-CM | POA: Diagnosis not present

## 2017-04-20 DIAGNOSIS — K648 Other hemorrhoids: Secondary | ICD-10-CM | POA: Diagnosis not present

## 2017-04-20 DIAGNOSIS — F410 Panic disorder [episodic paroxysmal anxiety] without agoraphobia: Secondary | ICD-10-CM | POA: Diagnosis not present

## 2017-04-20 DIAGNOSIS — G2581 Restless legs syndrome: Secondary | ICD-10-CM | POA: Diagnosis not present

## 2017-04-20 DIAGNOSIS — Z7982 Long term (current) use of aspirin: Secondary | ICD-10-CM | POA: Insufficient documentation

## 2017-04-20 DIAGNOSIS — Z79899 Other long term (current) drug therapy: Secondary | ICD-10-CM | POA: Insufficient documentation

## 2017-04-20 DIAGNOSIS — D510 Vitamin B12 deficiency anemia due to intrinsic factor deficiency: Secondary | ICD-10-CM | POA: Diagnosis not present

## 2017-04-20 HISTORY — PX: COLONOSCOPY: SHX5424

## 2017-04-20 SURGERY — COLONOSCOPY
Anesthesia: Moderate Sedation

## 2017-04-20 MED ORDER — MEPERIDINE HCL 100 MG/ML IJ SOLN
INTRAMUSCULAR | Status: AC
  Filled 2017-04-20: qty 1

## 2017-04-20 MED ORDER — MIDAZOLAM HCL 5 MG/5ML IJ SOLN
INTRAMUSCULAR | Status: DC | PRN
  Administered 2017-04-20: 1 mg via INTRAVENOUS
  Administered 2017-04-20: 4 mg via INTRAVENOUS

## 2017-04-20 MED ORDER — MIDAZOLAM HCL 5 MG/5ML IJ SOLN
INTRAMUSCULAR | Status: AC
  Filled 2017-04-20: qty 5

## 2017-04-20 MED ORDER — MEPERIDINE HCL 50 MG/ML IJ SOLN
INTRAMUSCULAR | Status: DC | PRN
  Administered 2017-04-20: 50 mg

## 2017-04-20 MED ORDER — SODIUM CHLORIDE 0.9 % IV SOLN
INTRAVENOUS | Status: DC
  Administered 2017-04-20: 07:00:00 via INTRAVENOUS

## 2017-04-20 NOTE — Discharge Instructions (Signed)
Colonoscopy, Adult, Care After This sheet gives you information about how to care for yourself after your procedure. Your health care provider may also give you more specific instructions. If you have problems or questions, contact your health care provider. What can I expect after the procedure? After the procedure, it is common to have:  A small amount of blood in your stool for 24 hours after the procedure.  Some gas.  Mild abdominal cramping or bloating.  Follow these instructions at home: General instructions   For the first 24 hours after the procedure: ? Do not drive or use machinery. ? Do not sign important documents. ? Do not drink alcohol. ? Do your regular daily activities at a slower pace than normal. ? Eat soft, easy-to-digest foods. ? Rest often.  Take over-the-counter or prescription medicines only as told by your health care provider.  It is up to you to get the results of your procedure. Ask your health care provider, or the department performing the procedure, when your results will be ready. Relieving cramping and bloating  Try walking around when you have cramps or feel bloated.  Apply heat to your abdomen as told by your health care provider. Use a heat source that your health care provider recommends, such as a moist heat pack or a heating pad. ? Place a towel between your skin and the heat source. ? Leave the heat on for 20-30 minutes. ? Remove the heat if your skin turns bright red. This is especially important if you are unable to feel pain, heat, or cold. You may have a greater risk of getting burned. Eating and drinking  Drink enough fluid to keep your urine clear or pale yellow.  Resume your normal diet as instructed by your health care provider. Avoid heavy or fried foods that are hard to digest.  Avoid drinking alcohol for as long as instructed by your health care provider. Contact a health care provider if:  You have blood in your stool 2-3  days after the procedure. Get help right away if:  You have more than a small spotting of blood in your stool.  You pass large blood clots in your stool.  Your abdomen is swollen.  You have nausea or vomiting.  You have a fever.  You have increasing abdominal pain that is not relieved with medicine. This information is not intended to replace advice given to you by your health care provider. Make sure you discuss any questions you have with your health care provider. Document Released: 05/12/2004 Document Revised: 06/22/2016 Document Reviewed: 12/10/2015 Elsevier Interactive Patient Education  2018 [employer].   Colon Polyps Polyps are tissue growths inside the body. Polyps can grow in many places, including the large intestine (colon). A polyp may be a round bump or a mushroom-shaped growth. You could have one polyp or several. Most colon polyps are noncancerous (benign). However, some colon polyps can become cancerous over time. What are the causes? The exact cause of colon polyps is not known. What increases the risk? This condition is more likely to develop in people who:  Have a family history of colon cancer or colon polyps.  Are older than 44 or older than 45 if they are African American.  Have inflammatory bowel disease, such as ulcerative colitis or Crohn disease.  Are overweight.  Smoke cigarettes.  Do not get enough exercise.  Drink too much alcohol.  Eat a diet that is: ? High in fat and red meat. ?  Low in fiber.  Had childhood cancer that was treated with abdominal radiation.  What are the signs or symptoms? Most polyps do not cause symptoms. If you have symptoms, they may include:  Blood coming from your rectum when having a bowel movement.  Blood in your stool.The stool may look dark red or black.  A change in bowel habits, such as constipation or diarrhea.  How is this diagnosed? This condition is diagnosed with a colonoscopy. This is a  procedure that uses a lighted, flexible scope to look at the inside of your colon. How is this treated? Treatment for this condition involves removing any polyps that are found. Those polyps will then be tested for cancer. If cancer is found, your health care provider will talk to you about options for colon cancer treatment. Follow these instructions at home: Diet  Eat plenty of fiber, such as fruits, vegetables, and whole grains.  Eat foods that are high in calcium and vitamin D, such as milk, cheese, yogurt, eggs, liver, fish, and broccoli.  Limit foods high in fat, red meats, and processed meats, such as hot dogs, sausage, bacon, and lunch meats.  Maintain a healthy weight, or lose weight if recommended by your health care provider. General instructions  Do not smoke cigarettes.  Do not drink alcohol excessively.  Keep all follow-up visits as told by your health care provider. This is important. This includes keeping regularly scheduled colonoscopies. Talk to your health care provider about when you need a colonoscopy.  Exercise every day or as told by your health care provider. Contact a health care provider if:  You have new or worsening bleeding during a bowel movement.  You have new or increased blood in your stool.  You have a change in bowel habits.  You unexpectedly lose weight. This information is not intended to replace advice given to you by your health care provider. Make sure you discuss any questions you have with your health care provider. Document Released: 06/24/2004 Document Revised: 03/05/2016 Document Reviewed: 08/19/2015 Elsevier Interactive Patient Education  [employer].

## 2017-04-20 NOTE — Op Note (Signed)
[facility] Patient Name: Colleen Nash Procedure Date: 04/20/2017 7:05 AM MRN: [id number] Date of Birth: 01-May-1951 Attending MD: Aviva Signs , MD CSN: [id number] Age: 66 Admit Type: Outpatient Procedure:                Colonoscopy Indications:              Screening for colorectal malignant neoplasm Providers:                Aviva Signs, MD, Janeece Riggers, RN, Aram Candela Referring MD:              Medicines:                Midazolam 5 mg IV, Meperidine 50 mg IV Complications:            No immediate complications. Estimated Blood Loss:     Estimated blood loss: none. Procedure:                Pre-Anesthesia Assessment:                           - Prior to the procedure, a History and Physical                            was performed, and patient medications and                            allergies were reviewed. The patient is competent.                            The risks and benefits of the procedure and the                            sedation options and risks were discussed with the                            patient. All questions were answered and informed                            consent was obtained. Patient identification and                            proposed procedure were verified by the physician,                            the nurse and the technician in the endoscopy                            suite. Mental Status Examination: alert and                            oriented. Airway Examination: normal oropharyngeal                            airway and neck mobility. Respiratory Examination:  clear to auscultation. CV Examination: RRR, no                            murmurs, no S3 or S4. Prophylactic Antibiotics: The                            patient does not require prophylactic antibiotics.                            Prior Anticoagulants: The patient has taken no                            previous anticoagulant or antiplatelet  agents. ASA                            Grade Assessment: II - A patient with mild systemic                            disease. After reviewing the risks and benefits,                            the patient was deemed in satisfactory condition to                            undergo the procedure. The anesthesia plan was to                            use moderate sedation / analgesia (conscious                            sedation). Immediately prior to administration of                            medications, the patient was re-assessed for                            adequacy to receive sedatives. The heart rate,                            respiratory rate, oxygen saturations, blood                            pressure, adequacy of pulmonary ventilation, and                            response to care were monitored throughout the                            procedure. The physical status of the patient was                            re-assessed after the procedure.  After obtaining informed consent, the colonoscope                            was passed under direct vision. Throughout the                            procedure, the patient's blood pressure, pulse, and                            oxygen saturations were monitored continuously. The                            EC-[id number] ([id number]) scope was introduced through                            the anus and advanced to the the cecum, identified                            by the appendiceal orifice, ileocecal valve and                            palpation. The colonoscopy was performed with ease.                            The entire colon was well visualized. The patient                            tolerated the procedure well. The quality of the                            bowel preparation was adequate. No anatomical                            landmarks were photographed. The total duration of                             the procedure was 14 minutes. Scope In: 7:22:39 AM Scope Out: 7:35:36 AM Scope Withdrawal Time: 0 hours 8 minutes 20 seconds  Total Procedure Duration: 0 hours 12 minutes 57 seconds  Findings:      The perianal exam findings include non-thrombosed internal hemorrhoids.      A 2 mm polyp was found in the proximal ascending colon. The polyp was       hyperplastic. Coagulation for tissue destruction using snare was       successful. Estimated blood loss: none.      The exam was otherwise without abnormality on direct and retroflexion       views. Impression:               - Non-thrombosed internal hemorrhoids found on                            perianal exam.                           - One  2 mm polyp in the proximal ascending colon.                            Treated with a hot snare.                           - The examination was otherwise normal on direct                            and retroflexion views.                           - No specimens collected. Moderate Sedation:      Moderate (conscious) sedation was administered by the endoscopy nurse       and supervised by the endoscopist. The following parameters were       monitored: oxygen saturation, heart rate, blood pressure, and response       to care. Total physician intraservice time was 14 minutes. Recommendation:           - Patient has a contact number available for                            emergencies. The signs and symptoms of potential                            delayed complications were discussed with the                            patient. Return to normal activities tomorrow.                            Written discharge instructions were provided to the                            patient.                           - Written discharge instructions were provided to                            the patient.                           - The signs and symptoms of potential delayed                            complications  were discussed with the patient.                           - Patient has a contact number available for                            emergencies.                           - Return to normal activities tomorrow.                           -  Resume previous diet.                           - Continue present medications.                           - Repeat colonoscopy in 10 years for screening                            purposes. Procedure Code(s):        --- Professional ---                           [phone], Colonoscopy, flexible; with ablation of                            tumor(s), polyp(s), or other lesion(s) (includes                            pre- and post-dilation and guide wire passage, when                            performed)                           G0500, Moderate sedation services provided by the                            same physician or other qualified health care                            professional performing a gastrointestinal                            endoscopic service that sedation supports,                            requiring the presence of an independent trained                            observer to assist in the monitoring of the                            patient's level of consciousness and physiological                            status; initial 15 minutes of intra-service time;                            patient age 55 years or older (additional time may                            be reported with [phone], as appropriate) Diagnosis Code(s):        --- Professional ---  Z12.11, Encounter for screening for malignant                            neoplasm of colon                           D12.2, Benign neoplasm of ascending colon                           K64.8, Other hemorrhoids CPT copyright 2016 American [employer]. All rights reserved. The codes documented in this report are preliminary and upon coder review may  be revised  to meet current compliance requirements. Aviva Signs, MD Aviva Signs, MD 04/20/2017 7:42:07 AM This report has been signed electronically. Number of Addenda: 0

## 2017-04-20 NOTE — Interval H&P Note (Signed)
History and Physical Interval Note:  04/20/2017 7:17 AM  Donald Prose  has presented today for surgery, with the diagnosis of screening  The various methods of treatment have been discussed with the patient and family. After consideration of risks, benefits and other options for treatment, the patient has consented to  Procedure(s): COLONOSCOPY (N/A) as a surgical intervention .  The patient's history has been reviewed, patient examined, no change in status, stable for surgery.  I have reviewed the patient's chart and labs.  Questions were answered to the patient's satisfaction.     Aviva Signs

## 2017-04-21 DIAGNOSIS — F331 Major depressive disorder, recurrent, moderate: Secondary | ICD-10-CM | POA: Diagnosis not present

## 2017-04-21 DIAGNOSIS — N390 Urinary tract infection, site not specified: Secondary | ICD-10-CM | POA: Diagnosis not present

## 2017-04-21 DIAGNOSIS — I10 Essential (primary) hypertension: Secondary | ICD-10-CM | POA: Diagnosis not present

## 2017-04-21 DIAGNOSIS — F411 Generalized anxiety disorder: Secondary | ICD-10-CM | POA: Diagnosis not present

## 2017-04-23 ENCOUNTER — Encounter (HOSPITAL_COMMUNITY): Payer: Self-pay | Admitting: General Surgery

## 2017-05-04 DIAGNOSIS — D510 Vitamin B12 deficiency anemia due to intrinsic factor deficiency: Secondary | ICD-10-CM | POA: Diagnosis not present

## 2017-05-04 DIAGNOSIS — D473 Essential (hemorrhagic) thrombocythemia: Secondary | ICD-10-CM | POA: Diagnosis not present

## 2017-05-24 DIAGNOSIS — F5101 Primary insomnia: Secondary | ICD-10-CM | POA: Diagnosis not present

## 2017-05-24 DIAGNOSIS — F339 Major depressive disorder, recurrent, unspecified: Secondary | ICD-10-CM | POA: Diagnosis not present

## 2017-05-24 DIAGNOSIS — Z6823 Body mass index (BMI) 23.0-23.9, adult: Secondary | ICD-10-CM | POA: Diagnosis not present

## 2017-05-24 DIAGNOSIS — I10 Essential (primary) hypertension: Secondary | ICD-10-CM | POA: Diagnosis not present

## 2017-06-28 DIAGNOSIS — N390 Urinary tract infection, site not specified: Secondary | ICD-10-CM | POA: Diagnosis not present

## 2017-06-28 DIAGNOSIS — Z6823 Body mass index (BMI) 23.0-23.9, adult: Secondary | ICD-10-CM | POA: Diagnosis not present

## 2017-06-28 DIAGNOSIS — R300 Dysuria: Secondary | ICD-10-CM | POA: Diagnosis not present

## 2017-06-30 DIAGNOSIS — M7541 Impingement syndrome of right shoulder: Secondary | ICD-10-CM | POA: Diagnosis not present

## 2017-06-30 DIAGNOSIS — M7542 Impingement syndrome of left shoulder: Secondary | ICD-10-CM | POA: Diagnosis not present

## 2017-08-09 DIAGNOSIS — D473 Essential (hemorrhagic) thrombocythemia: Secondary | ICD-10-CM | POA: Diagnosis not present

## 2017-08-09 DIAGNOSIS — D510 Vitamin B12 deficiency anemia due to intrinsic factor deficiency: Secondary | ICD-10-CM | POA: Diagnosis not present

## 2017-08-16 DIAGNOSIS — F419 Anxiety disorder, unspecified: Secondary | ICD-10-CM | POA: Diagnosis not present

## 2017-08-16 DIAGNOSIS — D473 Essential (hemorrhagic) thrombocythemia: Secondary | ICD-10-CM | POA: Diagnosis not present

## 2017-08-16 DIAGNOSIS — D510 Vitamin B12 deficiency anemia due to intrinsic factor deficiency: Secondary | ICD-10-CM | POA: Diagnosis not present

## 2017-08-16 DIAGNOSIS — M2550 Pain in unspecified joint: Secondary | ICD-10-CM | POA: Diagnosis not present

## 2017-08-26 DIAGNOSIS — D471 Chronic myeloproliferative disease: Secondary | ICD-10-CM | POA: Diagnosis not present

## 2017-08-26 DIAGNOSIS — D473 Essential (hemorrhagic) thrombocythemia: Secondary | ICD-10-CM | POA: Diagnosis not present

## 2017-08-26 DIAGNOSIS — D801 Nonfamilial hypogammaglobulinemia: Secondary | ICD-10-CM | POA: Diagnosis not present

## 2017-08-26 DIAGNOSIS — D6489 Other specified anemias: Secondary | ICD-10-CM | POA: Diagnosis not present

## 2017-08-30 DIAGNOSIS — R7301 Impaired fasting glucose: Secondary | ICD-10-CM | POA: Diagnosis not present

## 2017-08-30 DIAGNOSIS — I10 Essential (primary) hypertension: Secondary | ICD-10-CM | POA: Diagnosis not present

## 2017-08-31 DIAGNOSIS — D696 Thrombocytopenia, unspecified: Secondary | ICD-10-CM | POA: Diagnosis not present

## 2017-08-31 DIAGNOSIS — Z01419 Encounter for gynecological examination (general) (routine) without abnormal findings: Secondary | ICD-10-CM | POA: Diagnosis not present

## 2017-08-31 DIAGNOSIS — F411 Generalized anxiety disorder: Secondary | ICD-10-CM | POA: Diagnosis not present

## 2017-08-31 DIAGNOSIS — E782 Mixed hyperlipidemia: Secondary | ICD-10-CM | POA: Diagnosis not present

## 2017-08-31 DIAGNOSIS — R7301 Impaired fasting glucose: Secondary | ICD-10-CM | POA: Diagnosis not present

## 2017-09-07 DIAGNOSIS — D471 Chronic myeloproliferative disease: Secondary | ICD-10-CM | POA: Diagnosis not present

## 2017-09-07 DIAGNOSIS — D473 Essential (hemorrhagic) thrombocythemia: Secondary | ICD-10-CM | POA: Diagnosis not present

## 2017-09-17 DIAGNOSIS — Z23 Encounter for immunization: Secondary | ICD-10-CM | POA: Diagnosis not present

## 2017-10-27 DIAGNOSIS — R110 Nausea: Secondary | ICD-10-CM | POA: Diagnosis not present

## 2017-10-27 DIAGNOSIS — D473 Essential (hemorrhagic) thrombocythemia: Secondary | ICD-10-CM | POA: Diagnosis not present

## 2017-10-27 DIAGNOSIS — N390 Urinary tract infection, site not specified: Secondary | ICD-10-CM | POA: Diagnosis not present

## 2017-10-27 DIAGNOSIS — I10 Essential (primary) hypertension: Secondary | ICD-10-CM | POA: Diagnosis not present

## 2017-11-15 DIAGNOSIS — M545 Low back pain: Secondary | ICD-10-CM | POA: Diagnosis not present

## 2017-11-16 DIAGNOSIS — M533 Sacrococcygeal disorders, not elsewhere classified: Secondary | ICD-10-CM | POA: Diagnosis not present

## 2017-12-03 DIAGNOSIS — D473 Essential (hemorrhagic) thrombocythemia: Secondary | ICD-10-CM | POA: Diagnosis not present

## 2017-12-03 DIAGNOSIS — D510 Vitamin B12 deficiency anemia due to intrinsic factor deficiency: Secondary | ICD-10-CM | POA: Diagnosis not present

## 2018-01-08 IMAGING — CT CT L SPINE W/O CM
3 of 9 series · 10 of 33 positions shown, 12 images · non-contrast
Comparison: MRI 06/06/2012, plain film 02/22/2012

CLINICAL DATA: 65-year-old female with a history of right shoulder
and bilateral leg pain.

EXAM:
CT LUMBAR SPINE WITHOUT CONTRAST
TECHNIQUE: Multidetector CT imaging of the lumbar spine was performed without
intravenous contrast administration. Multiplanar CT image
reconstructions were also generated.

[Series 3: l spine soft · axial · 0.29mm/px · z∈[+930,+1022]mm · 2 of 108 slices shown, 3 images]
[im 31/108  soft-tissue]
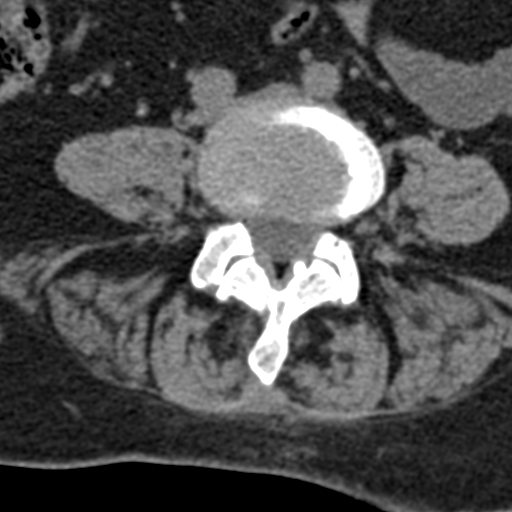
[im 31/108  bone]
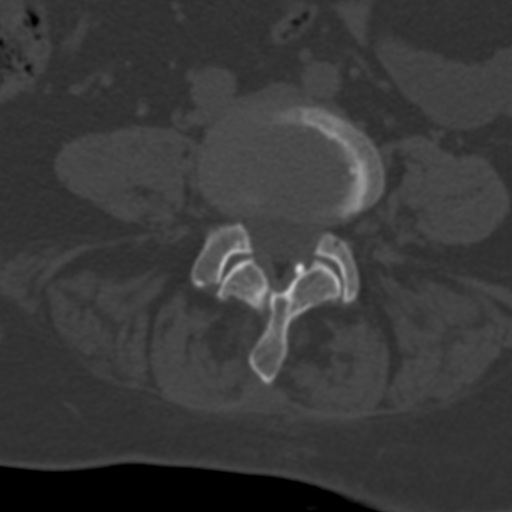
[im 77/108  bone]
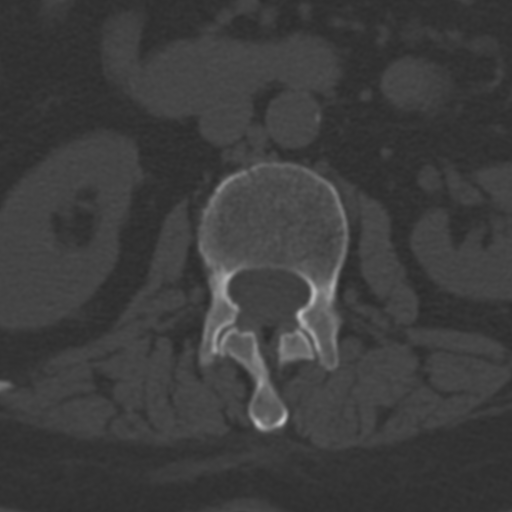

[Series 4: sagittal bone · sagittal · 0.22mm/px · 5 of 55 slices shown, 6 images]
[im 19/55  bone]
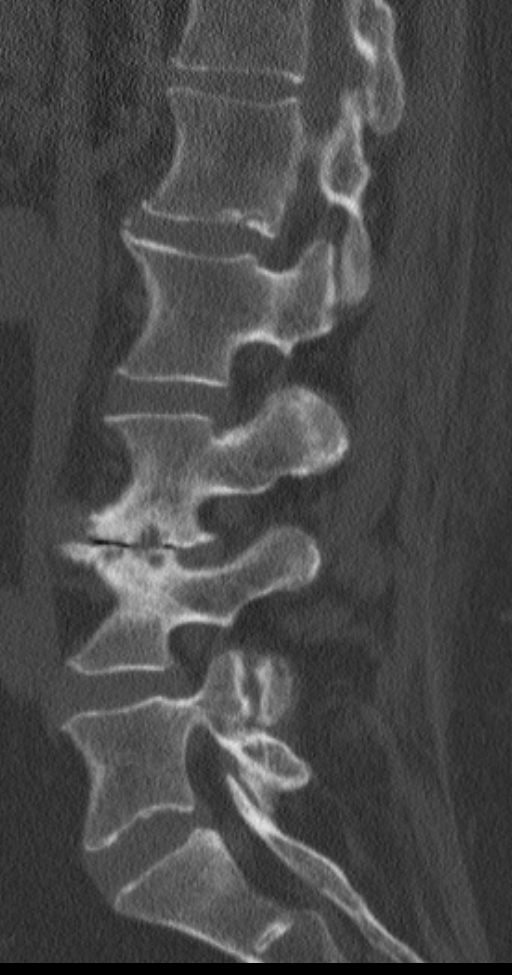
[im 23/55  bone]
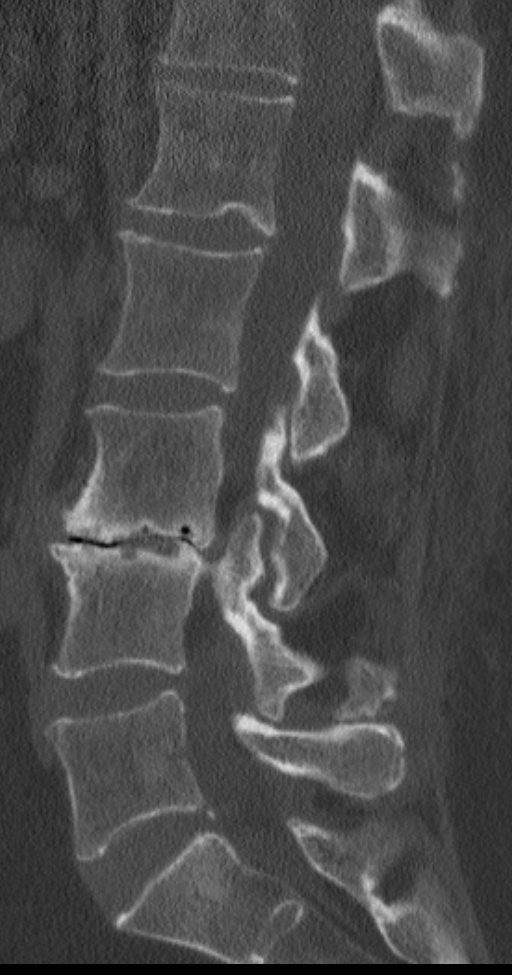
[im 28/55  soft-tissue]
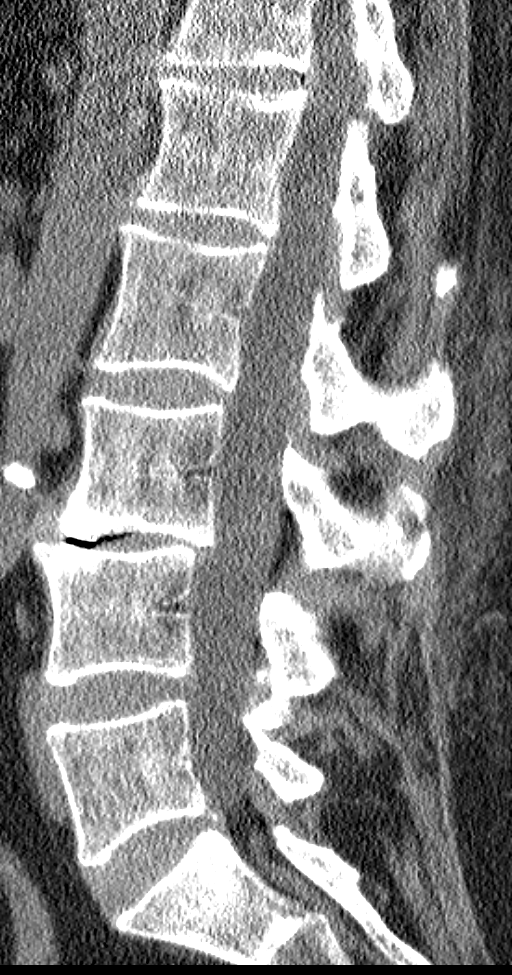
[im 28/55  bone]
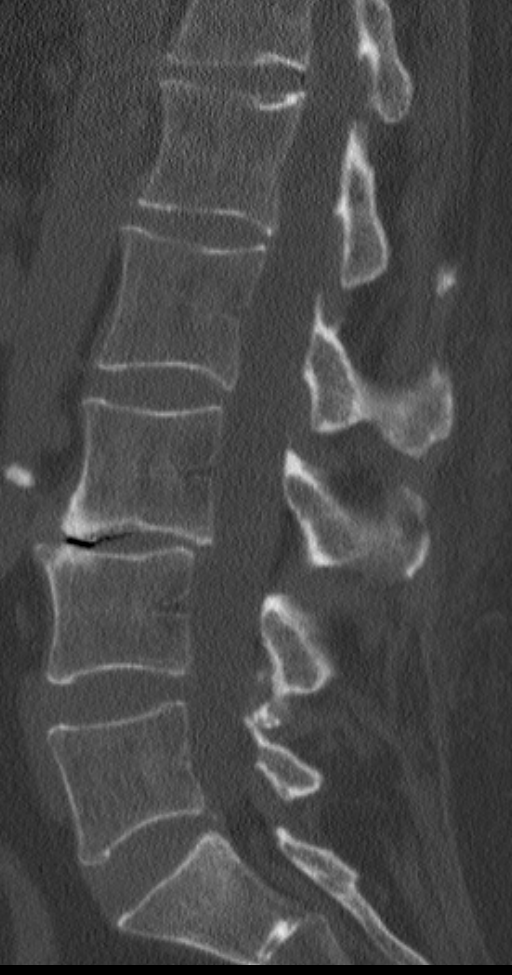
[im 32/55  bone]
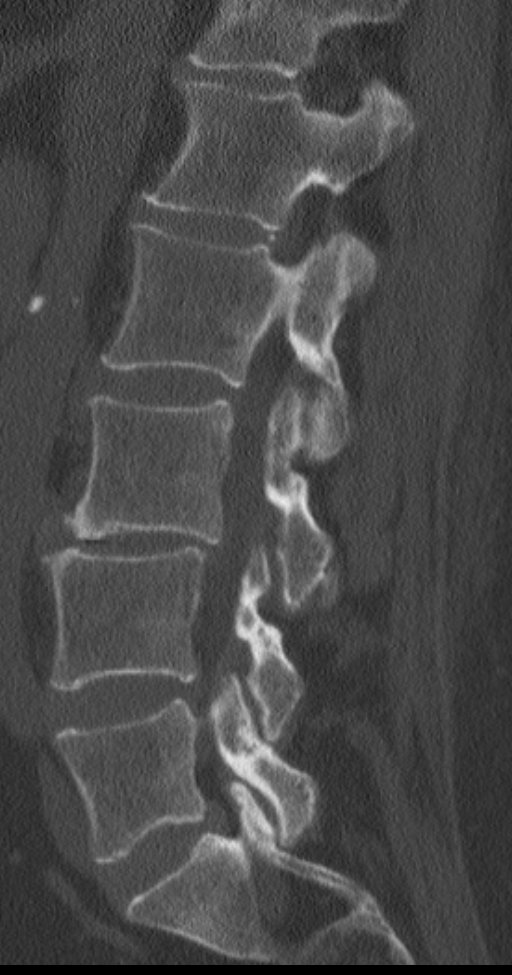
[im 37/55  bone]
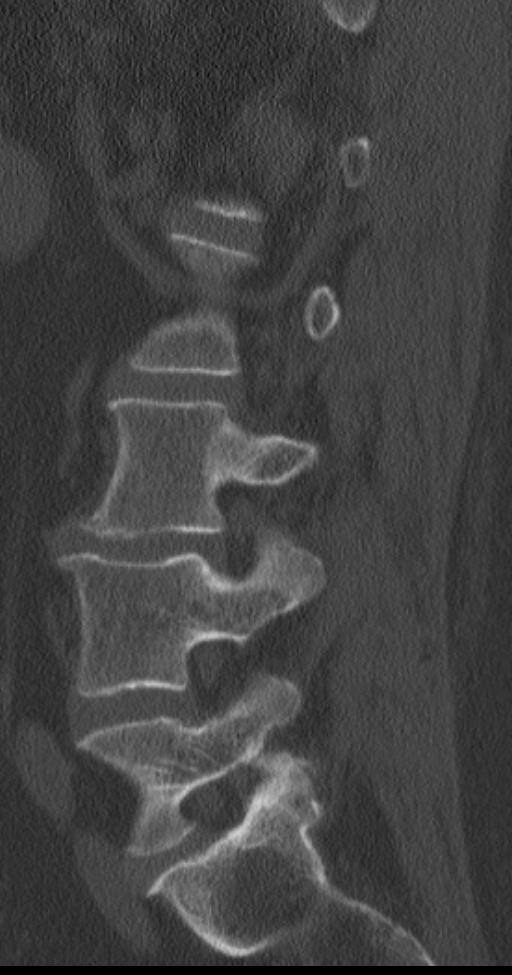

[Series 5: coronal bone · coronal · 0.28mm/px · 3 of 69 slices shown]
[im 14/69  bone]
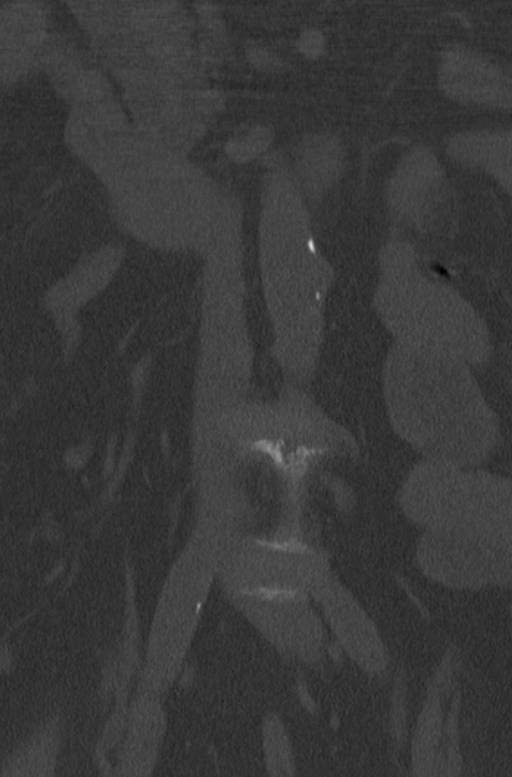
[im 28/69  bone]
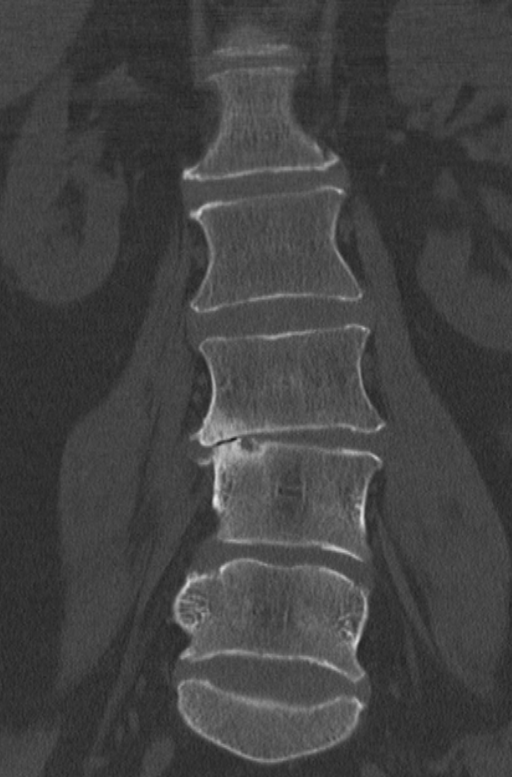
[im 41/69  bone]
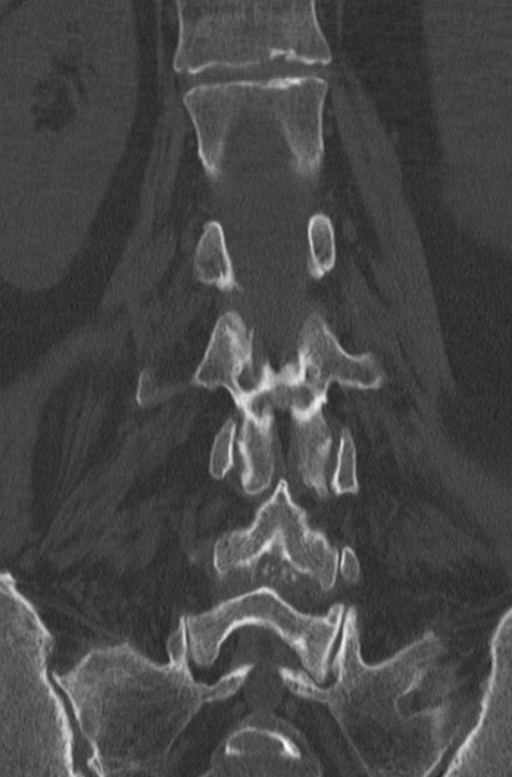

[10 of 33 positions shown; findings below may reference images not displayed]

FINDINGS: Lumbar vertebral elements maintain relative normal alignment without
subluxation. Coronal reformatted images demonstrate mild left apex
scoliotic curvature of the lumbar spine which is centered at the
degenerative changes at L3-L4.

Trace retrolisthesis of L3 on L4 secondary to degenerative changes.

Facets maintain alignment no displaced pars defects.

No acute fracture identified.

Since the comparison MR and plain film, there has been progression
of disc space narrowing endplate changes and anterior osteophyte
production at the L3-L4 level. Vacuum disc phenomenon is present.
The trace retrolisthesis at this level does not significantly in
narrow the canal, which measures 1.4 cm at this level.

Additional levels throughout the lumbar spine demonstrate early disc
space narrowing without significant endplate changes.

L1-L2: No significant canal narrowing or neural foraminal narrowing.

L2-L3: No significant canal narrowing. Early ligamentum flavum
hypertrophy. No significant foraminal narrowing.

L3-L4: Loss of disc height and broad-based disc bulge contributes to
bilateral foraminal narrowing, worst on the right. Tri-lateral
narrowing of the canal secondary to facet hypertrophy and ligamentum
flavum hypertrophy.

L4-L5: Ligamentum flavum hypertrophy and broad-based disc bulge
contribute to mild canal narrowing.

L5-S1: Broad-based disc bulge, with focal left-sided protrusion in
the foramen, similar to prior MRI.

No evidence of canal hemorrhage.

Calcifications of the abdominal aorta.
IMPRESSION: No acute fracture or malalignment of the lumbar spine.

Compared to MR and plain film of 5395, there has been progression of
disc disease which is most pronounced at the L3-L4 level. At this
level there is vacuum disc phenomenon, endplate changes, trace
retrolisthesis, however, no significant canal narrowing. Facet
changes and disc changes do result in right greater than left neural
foraminal narrowing.

Additional changes as above.

Aortic atherosclerosis.

## 2018-01-13 DIAGNOSIS — I10 Essential (primary) hypertension: Secondary | ICD-10-CM | POA: Diagnosis not present

## 2018-01-13 DIAGNOSIS — F419 Anxiety disorder, unspecified: Secondary | ICD-10-CM | POA: Diagnosis not present

## 2018-01-13 DIAGNOSIS — D473 Essential (hemorrhagic) thrombocythemia: Secondary | ICD-10-CM | POA: Diagnosis not present

## 2018-01-13 DIAGNOSIS — E785 Hyperlipidemia, unspecified: Secondary | ICD-10-CM | POA: Diagnosis not present

## 2018-01-14 DIAGNOSIS — R5383 Other fatigue: Secondary | ICD-10-CM | POA: Diagnosis not present

## 2018-01-14 DIAGNOSIS — E119 Type 2 diabetes mellitus without complications: Secondary | ICD-10-CM | POA: Diagnosis not present

## 2018-01-14 DIAGNOSIS — I10 Essential (primary) hypertension: Secondary | ICD-10-CM | POA: Diagnosis not present

## 2018-01-14 DIAGNOSIS — E785 Hyperlipidemia, unspecified: Secondary | ICD-10-CM | POA: Diagnosis not present

## 2018-01-18 DIAGNOSIS — D473 Essential (hemorrhagic) thrombocythemia: Secondary | ICD-10-CM | POA: Diagnosis not present

## 2018-01-18 DIAGNOSIS — R1310 Dysphagia, unspecified: Secondary | ICD-10-CM | POA: Diagnosis not present

## 2018-01-18 DIAGNOSIS — H547 Unspecified visual loss: Secondary | ICD-10-CM | POA: Diagnosis not present

## 2018-02-11 DIAGNOSIS — F419 Anxiety disorder, unspecified: Secondary | ICD-10-CM | POA: Diagnosis not present

## 2018-02-11 DIAGNOSIS — K219 Gastro-esophageal reflux disease without esophagitis: Secondary | ICD-10-CM | POA: Diagnosis not present

## 2018-02-11 DIAGNOSIS — M791 Myalgia, unspecified site: Secondary | ICD-10-CM | POA: Diagnosis not present

## 2018-02-25 DIAGNOSIS — R079 Chest pain, unspecified: Secondary | ICD-10-CM | POA: Diagnosis not present

## 2018-02-25 DIAGNOSIS — R5383 Other fatigue: Secondary | ICD-10-CM | POA: Diagnosis not present

## 2018-02-25 DIAGNOSIS — R1310 Dysphagia, unspecified: Secondary | ICD-10-CM | POA: Diagnosis not present

## 2018-02-25 DIAGNOSIS — K219 Gastro-esophageal reflux disease without esophagitis: Secondary | ICD-10-CM | POA: Diagnosis not present

## 2018-02-25 DIAGNOSIS — R109 Unspecified abdominal pain: Secondary | ICD-10-CM | POA: Diagnosis not present

## 2018-02-25 DIAGNOSIS — D473 Essential (hemorrhagic) thrombocythemia: Secondary | ICD-10-CM | POA: Diagnosis not present

## 2018-02-25 DIAGNOSIS — M791 Myalgia, unspecified site: Secondary | ICD-10-CM | POA: Diagnosis not present

## 2018-03-01 DIAGNOSIS — R079 Chest pain, unspecified: Secondary | ICD-10-CM | POA: Diagnosis not present

## 2018-03-01 DIAGNOSIS — I10 Essential (primary) hypertension: Secondary | ICD-10-CM | POA: Diagnosis not present

## 2018-03-19 DIAGNOSIS — S63501A Unspecified sprain of right wrist, initial encounter: Secondary | ICD-10-CM | POA: Diagnosis not present

## 2018-03-19 DIAGNOSIS — S6991XA Unspecified injury of right wrist, hand and finger(s), initial encounter: Secondary | ICD-10-CM | POA: Diagnosis not present

## 2018-04-06 DIAGNOSIS — D473 Essential (hemorrhagic) thrombocythemia: Secondary | ICD-10-CM | POA: Diagnosis not present

## 2018-04-06 DIAGNOSIS — D510 Vitamin B12 deficiency anemia due to intrinsic factor deficiency: Secondary | ICD-10-CM | POA: Diagnosis not present

## 2018-04-12 DIAGNOSIS — R0602 Shortness of breath: Secondary | ICD-10-CM | POA: Diagnosis not present

## 2018-04-12 DIAGNOSIS — R079 Chest pain, unspecified: Secondary | ICD-10-CM | POA: Diagnosis not present

## 2018-04-12 DIAGNOSIS — D473 Essential (hemorrhagic) thrombocythemia: Secondary | ICD-10-CM | POA: Diagnosis not present

## 2018-04-12 DIAGNOSIS — E785 Hyperlipidemia, unspecified: Secondary | ICD-10-CM | POA: Diagnosis not present

## 2018-04-12 DIAGNOSIS — I10 Essential (primary) hypertension: Secondary | ICD-10-CM | POA: Diagnosis not present

## 2018-04-20 DIAGNOSIS — R1310 Dysphagia, unspecified: Secondary | ICD-10-CM | POA: Diagnosis not present

## 2018-04-20 DIAGNOSIS — K219 Gastro-esophageal reflux disease without esophagitis: Secondary | ICD-10-CM | POA: Diagnosis not present

## 2018-04-27 DIAGNOSIS — R1310 Dysphagia, unspecified: Secondary | ICD-10-CM | POA: Diagnosis not present

## 2018-04-27 DIAGNOSIS — K2950 Unspecified chronic gastritis without bleeding: Secondary | ICD-10-CM | POA: Diagnosis not present

## 2018-04-27 DIAGNOSIS — E7800 Pure hypercholesterolemia, unspecified: Secondary | ICD-10-CM | POA: Diagnosis not present

## 2018-04-27 DIAGNOSIS — K219 Gastro-esophageal reflux disease without esophagitis: Secondary | ICD-10-CM | POA: Diagnosis not present

## 2018-04-27 DIAGNOSIS — D759 Disease of blood and blood-forming organs, unspecified: Secondary | ICD-10-CM | POA: Diagnosis not present

## 2018-04-27 DIAGNOSIS — Z7982 Long term (current) use of aspirin: Secondary | ICD-10-CM | POA: Diagnosis not present

## 2018-04-27 DIAGNOSIS — I10 Essential (primary) hypertension: Secondary | ICD-10-CM | POA: Diagnosis not present

## 2018-04-27 DIAGNOSIS — K222 Esophageal obstruction: Secondary | ICD-10-CM | POA: Diagnosis not present

## 2018-04-27 DIAGNOSIS — Z8711 Personal history of peptic ulcer disease: Secondary | ICD-10-CM | POA: Diagnosis not present

## 2018-05-17 DIAGNOSIS — M7541 Impingement syndrome of right shoulder: Secondary | ICD-10-CM | POA: Diagnosis not present

## 2018-05-17 DIAGNOSIS — M7542 Impingement syndrome of left shoulder: Secondary | ICD-10-CM | POA: Diagnosis not present

## 2018-05-19 DIAGNOSIS — M461 Sacroiliitis, not elsewhere classified: Secondary | ICD-10-CM | POA: Diagnosis not present

## 2018-05-25 DIAGNOSIS — G4700 Insomnia, unspecified: Secondary | ICD-10-CM | POA: Diagnosis not present

## 2018-05-25 DIAGNOSIS — I10 Essential (primary) hypertension: Secondary | ICD-10-CM | POA: Diagnosis not present

## 2018-05-25 DIAGNOSIS — H6693 Otitis media, unspecified, bilateral: Secondary | ICD-10-CM | POA: Diagnosis not present

## 2018-05-25 DIAGNOSIS — B370 Candidal stomatitis: Secondary | ICD-10-CM | POA: Diagnosis not present

## 2018-05-25 DIAGNOSIS — E785 Hyperlipidemia, unspecified: Secondary | ICD-10-CM | POA: Diagnosis not present

## 2018-05-30 DIAGNOSIS — R5383 Other fatigue: Secondary | ICD-10-CM | POA: Diagnosis not present

## 2018-05-30 DIAGNOSIS — E785 Hyperlipidemia, unspecified: Secondary | ICD-10-CM | POA: Diagnosis not present

## 2018-05-30 DIAGNOSIS — I10 Essential (primary) hypertension: Secondary | ICD-10-CM | POA: Diagnosis not present

## 2018-06-01 DIAGNOSIS — R5383 Other fatigue: Secondary | ICD-10-CM | POA: Diagnosis not present

## 2018-06-01 DIAGNOSIS — M791 Myalgia, unspecified site: Secondary | ICD-10-CM | POA: Diagnosis not present

## 2018-06-06 DIAGNOSIS — R739 Hyperglycemia, unspecified: Secondary | ICD-10-CM | POA: Diagnosis not present

## 2018-06-06 DIAGNOSIS — D72829 Elevated white blood cell count, unspecified: Secondary | ICD-10-CM | POA: Diagnosis not present

## 2018-06-06 DIAGNOSIS — I10 Essential (primary) hypertension: Secondary | ICD-10-CM | POA: Diagnosis not present

## 2018-06-06 DIAGNOSIS — E785 Hyperlipidemia, unspecified: Secondary | ICD-10-CM | POA: Diagnosis not present

## 2018-06-06 DIAGNOSIS — G4700 Insomnia, unspecified: Secondary | ICD-10-CM | POA: Diagnosis not present

## 2018-06-06 DIAGNOSIS — J329 Chronic sinusitis, unspecified: Secondary | ICD-10-CM | POA: Diagnosis not present

## 2018-06-21 DIAGNOSIS — I517 Cardiomegaly: Secondary | ICD-10-CM | POA: Diagnosis not present

## 2018-06-21 DIAGNOSIS — I519 Heart disease, unspecified: Secondary | ICD-10-CM | POA: Diagnosis not present

## 2018-06-23 DIAGNOSIS — R0602 Shortness of breath: Secondary | ICD-10-CM | POA: Diagnosis not present

## 2018-06-23 DIAGNOSIS — R079 Chest pain, unspecified: Secondary | ICD-10-CM | POA: Diagnosis not present

## 2018-07-28 DIAGNOSIS — D473 Essential (hemorrhagic) thrombocythemia: Secondary | ICD-10-CM | POA: Diagnosis not present

## 2018-07-28 DIAGNOSIS — J343 Hypertrophy of nasal turbinates: Secondary | ICD-10-CM | POA: Diagnosis not present

## 2018-07-28 DIAGNOSIS — J342 Deviated nasal septum: Secondary | ICD-10-CM | POA: Diagnosis not present

## 2018-07-28 DIAGNOSIS — J324 Chronic pansinusitis: Secondary | ICD-10-CM | POA: Diagnosis not present

## 2018-07-30 DIAGNOSIS — Z23 Encounter for immunization: Secondary | ICD-10-CM | POA: Diagnosis not present

## 2018-08-03 DIAGNOSIS — D510 Vitamin B12 deficiency anemia due to intrinsic factor deficiency: Secondary | ICD-10-CM | POA: Diagnosis not present

## 2018-08-03 DIAGNOSIS — D473 Essential (hemorrhagic) thrombocythemia: Secondary | ICD-10-CM | POA: Diagnosis not present

## 2018-08-22 DIAGNOSIS — J324 Chronic pansinusitis: Secondary | ICD-10-CM | POA: Diagnosis not present

## 2018-08-23 DIAGNOSIS — E785 Hyperlipidemia, unspecified: Secondary | ICD-10-CM | POA: Diagnosis not present

## 2018-08-23 DIAGNOSIS — D473 Essential (hemorrhagic) thrombocythemia: Secondary | ICD-10-CM | POA: Diagnosis not present

## 2018-08-23 DIAGNOSIS — R61 Generalized hyperhidrosis: Secondary | ICD-10-CM | POA: Diagnosis not present

## 2018-08-23 DIAGNOSIS — I10 Essential (primary) hypertension: Secondary | ICD-10-CM | POA: Diagnosis not present

## 2018-08-23 DIAGNOSIS — R5383 Other fatigue: Secondary | ICD-10-CM | POA: Diagnosis not present

## 2018-08-23 DIAGNOSIS — E119 Type 2 diabetes mellitus without complications: Secondary | ICD-10-CM | POA: Diagnosis not present

## 2018-08-23 DIAGNOSIS — F419 Anxiety disorder, unspecified: Secondary | ICD-10-CM | POA: Diagnosis not present

## 2018-08-23 DIAGNOSIS — Z79899 Other long term (current) drug therapy: Secondary | ICD-10-CM | POA: Diagnosis not present

## 2018-08-23 DIAGNOSIS — G4700 Insomnia, unspecified: Secondary | ICD-10-CM | POA: Diagnosis not present

## 2018-08-29 DIAGNOSIS — J0141 Acute recurrent pansinusitis: Secondary | ICD-10-CM | POA: Diagnosis not present

## 2018-08-29 DIAGNOSIS — J342 Deviated nasal septum: Secondary | ICD-10-CM | POA: Diagnosis not present

## 2018-08-29 DIAGNOSIS — J343 Hypertrophy of nasal turbinates: Secondary | ICD-10-CM | POA: Diagnosis not present

## 2018-08-30 DIAGNOSIS — D751 Secondary polycythemia: Secondary | ICD-10-CM | POA: Diagnosis not present

## 2018-08-30 DIAGNOSIS — D473 Essential (hemorrhagic) thrombocythemia: Secondary | ICD-10-CM | POA: Diagnosis not present

## 2018-08-30 DIAGNOSIS — D72829 Elevated white blood cell count, unspecified: Secondary | ICD-10-CM | POA: Diagnosis not present

## 2018-09-12 DIAGNOSIS — L814 Other melanin hyperpigmentation: Secondary | ICD-10-CM | POA: Diagnosis not present

## 2018-09-12 DIAGNOSIS — R208 Other disturbances of skin sensation: Secondary | ICD-10-CM | POA: Diagnosis not present

## 2018-09-12 DIAGNOSIS — L538 Other specified erythematous conditions: Secondary | ICD-10-CM | POA: Diagnosis not present

## 2018-09-12 DIAGNOSIS — L820 Inflamed seborrheic keratosis: Secondary | ICD-10-CM | POA: Diagnosis not present

## 2018-09-16 DIAGNOSIS — S61218A Laceration without foreign body of other finger without damage to nail, initial encounter: Secondary | ICD-10-CM | POA: Diagnosis not present

## 2018-09-16 DIAGNOSIS — W268XXA Contact with other sharp object(s), not elsewhere classified, initial encounter: Secondary | ICD-10-CM | POA: Diagnosis not present

## 2018-09-16 DIAGNOSIS — S61217A Laceration without foreign body of left little finger without damage to nail, initial encounter: Secondary | ICD-10-CM | POA: Diagnosis not present

## 2018-10-16 DIAGNOSIS — N390 Urinary tract infection, site not specified: Secondary | ICD-10-CM | POA: Diagnosis not present

## 2018-10-24 DIAGNOSIS — J069 Acute upper respiratory infection, unspecified: Secondary | ICD-10-CM | POA: Diagnosis not present

## 2018-10-24 DIAGNOSIS — R110 Nausea: Secondary | ICD-10-CM | POA: Diagnosis not present

## 2018-10-24 DIAGNOSIS — N390 Urinary tract infection, site not specified: Secondary | ICD-10-CM | POA: Diagnosis not present

## 2018-11-01 DIAGNOSIS — N390 Urinary tract infection, site not specified: Secondary | ICD-10-CM | POA: Diagnosis not present

## 2018-11-01 DIAGNOSIS — Z79899 Other long term (current) drug therapy: Secondary | ICD-10-CM | POA: Diagnosis not present

## 2018-11-01 DIAGNOSIS — R81 Glycosuria: Secondary | ICD-10-CM | POA: Diagnosis not present

## 2018-11-16 DIAGNOSIS — M797 Fibromyalgia: Secondary | ICD-10-CM | POA: Diagnosis not present

## 2018-12-06 DIAGNOSIS — R5383 Other fatigue: Secondary | ICD-10-CM | POA: Diagnosis not present

## 2018-12-06 DIAGNOSIS — E119 Type 2 diabetes mellitus without complications: Secondary | ICD-10-CM | POA: Diagnosis not present

## 2018-12-06 DIAGNOSIS — I10 Essential (primary) hypertension: Secondary | ICD-10-CM | POA: Diagnosis not present

## 2018-12-06 DIAGNOSIS — E785 Hyperlipidemia, unspecified: Secondary | ICD-10-CM | POA: Diagnosis not present

## 2018-12-06 DIAGNOSIS — E569 Vitamin deficiency, unspecified: Secondary | ICD-10-CM | POA: Diagnosis not present

## 2018-12-13 DIAGNOSIS — G4700 Insomnia, unspecified: Secondary | ICD-10-CM | POA: Diagnosis not present

## 2019-02-13 DIAGNOSIS — M797 Fibromyalgia: Secondary | ICD-10-CM | POA: Diagnosis not present

## 2019-03-07 DIAGNOSIS — K2970 Gastritis, unspecified, without bleeding: Secondary | ICD-10-CM | POA: Diagnosis not present

## 2019-03-07 DIAGNOSIS — K279 Peptic ulcer, site unspecified, unspecified as acute or chronic, without hemorrhage or perforation: Secondary | ICD-10-CM | POA: Diagnosis not present

## 2019-03-07 DIAGNOSIS — R1310 Dysphagia, unspecified: Secondary | ICD-10-CM | POA: Diagnosis not present

## 2019-04-07 DIAGNOSIS — D473 Essential (hemorrhagic) thrombocythemia: Secondary | ICD-10-CM | POA: Diagnosis not present

## 2019-04-07 DIAGNOSIS — D510 Vitamin B12 deficiency anemia due to intrinsic factor deficiency: Secondary | ICD-10-CM | POA: Diagnosis not present

## 2019-05-11 ENCOUNTER — Other Ambulatory Visit: Payer: Self-pay

## 2019-06-26 DIAGNOSIS — H6983 Other specified disorders of Eustachian tube, bilateral: Secondary | ICD-10-CM | POA: Diagnosis not present

## 2019-06-26 DIAGNOSIS — M797 Fibromyalgia: Secondary | ICD-10-CM | POA: Diagnosis not present

## 2019-06-26 DIAGNOSIS — H903 Sensorineural hearing loss, bilateral: Secondary | ICD-10-CM | POA: Diagnosis not present

## 2019-08-11 DIAGNOSIS — Z23 Encounter for immunization: Secondary | ICD-10-CM | POA: Diagnosis not present

## 2019-08-23 DIAGNOSIS — Z23 Encounter for immunization: Secondary | ICD-10-CM | POA: Diagnosis not present

## 2019-08-31 DIAGNOSIS — M25511 Pain in right shoulder: Secondary | ICD-10-CM | POA: Diagnosis not present

## 2019-08-31 DIAGNOSIS — M25512 Pain in left shoulder: Secondary | ICD-10-CM | POA: Diagnosis not present

## 2019-09-19 DIAGNOSIS — Z789 Other specified health status: Secondary | ICD-10-CM | POA: Diagnosis not present

## 2019-09-19 DIAGNOSIS — L814 Other melanin hyperpigmentation: Secondary | ICD-10-CM | POA: Diagnosis not present

## 2019-09-19 DIAGNOSIS — L820 Inflamed seborrheic keratosis: Secondary | ICD-10-CM | POA: Diagnosis not present

## 2019-09-19 DIAGNOSIS — L821 Other seborrheic keratosis: Secondary | ICD-10-CM | POA: Diagnosis not present

## 2019-09-19 DIAGNOSIS — D1801 Hemangioma of skin and subcutaneous tissue: Secondary | ICD-10-CM | POA: Diagnosis not present

## 2019-09-19 DIAGNOSIS — L538 Other specified erythematous conditions: Secondary | ICD-10-CM | POA: Diagnosis not present

## 2021-08-01 ENCOUNTER — Other Ambulatory Visit: Payer: Self-pay

## 2021-08-01 ENCOUNTER — Encounter (HOSPITAL_COMMUNITY): Payer: Self-pay | Admitting: Emergency Medicine

## 2021-08-01 ENCOUNTER — Emergency Department (HOSPITAL_COMMUNITY)
Admission: EM | Admit: 2021-08-01 | Discharge: 2021-08-01 | Disposition: A | Discharge status: Home / Self Care | Payer: Medicare Other | Attending: Emergency Medicine | Admitting: Emergency Medicine

## 2021-08-01 ENCOUNTER — Emergency Department (HOSPITAL_COMMUNITY): Payer: Medicare Other

## 2021-08-01 DIAGNOSIS — S8992XA Unspecified injury of left lower leg, initial encounter: Secondary | ICD-10-CM | POA: Diagnosis present

## 2021-08-01 DIAGNOSIS — Z7982 Long term (current) use of aspirin: Secondary | ICD-10-CM | POA: Diagnosis not present

## 2021-08-01 DIAGNOSIS — S8264XA Nondisplaced fracture of lateral malleolus of right fibula, initial encounter for closed fracture: Secondary | ICD-10-CM | POA: Insufficient documentation

## 2021-08-01 DIAGNOSIS — W1839XA Other fall on same level, initial encounter: Secondary | ICD-10-CM | POA: Diagnosis not present

## 2021-08-01 DIAGNOSIS — M25551 Pain in right hip: Secondary | ICD-10-CM | POA: Insufficient documentation

## 2021-08-01 DIAGNOSIS — S8265XA Nondisplaced fracture of lateral malleolus of left fibula, initial encounter for closed fracture: Secondary | ICD-10-CM | POA: Diagnosis not present

## 2021-08-01 MED ORDER — OXYCODONE HCL 5 MG PO TABS: 5.0000 mg | ORAL_TABLET | Freq: Four times a day (QID) | ORAL | 0 refills | Status: AC | PRN

## 2021-08-01 MED ORDER — FENTANYL CITRATE PF 50 MCG/ML IJ SOSY
50.0000 ug | PREFILLED_SYRINGE | INTRAMUSCULAR | Status: DC | PRN
  Administered 2021-08-01 (×2): 50 ug via INTRAVENOUS
  Filled 2021-08-01 (×2): qty 1

## 2021-08-01 NOTE — ED Provider Notes (Signed)
[facility] EMERGENCY DEPARTMENT Provider Note   CSN: [id number] Arrival date & time: 08/01/21  1208     History No chief complaint on file.   Colleen Nash is a 70 y.o. female.  HPI Patient presents after stepping in a hole in her yard.  She was reportedly carrying paint.  She missed stepped into a hole and fell to the ground.  She has had pain and swelling in her bilateral ankles.  She also endorses pain in bilateral feet and her right hip.  She denies striking her head or losing consciousness.  He is not on any blood thinners.  She arrived via EMS and fentanyl was given prior to arrival.  She does report lessen the severity of pain after the fentanyl.    Past Medical History:  Diagnosis Date   Anxiety    Back pain    Hypercholesteremia    Panic attacks    Pernicious anemia 07/08/2016   Restless leg    Thrombocytosis 07/10/2016   Urinary tract infection     Patient Active Problem List   Diagnosis Date Noted   Special screening for malignant neoplasms, colon    Benign neoplasm of ascending colon    Chronic pain disorder 08/21/2016   Nausea and vomiting 08/21/2016   Nausea & vomiting 08/20/2016   Thrombocytosis 07/10/2016   Pernicious anemia 07/08/2016   Abdominal aneurysm without mention of rupture 03/01/2014   DYSPEPSIA 06/05/2009    Past Surgical History:  Procedure Laterality Date   CESAREAN SECTION     CHOLECYSTECTOMY     COLONOSCOPY N/A 04/20/2017   Procedure: COLONOSCOPY;  Surgeon: Aviva Signs, MD;  Location: [facility];  Service: Gastroenterology;  Laterality: N/A;   TUBAL LIGATION       OB History   No obstetric history on file.     Family History  Problem Relation Age of Onset   Heart failure Mother    Heart disease Mother    Cancer Father    Heart disease Father    Hyperlipidemia Sister    Hypertension Sister    Heart disease Sister        before age 37   Varicose Veins Sister    Heart attack Sister    AAA (abdominal aortic  aneurysm) Sister    Heart attack Sister     Social History   Tobacco Use   Smoking status: Never   Smokeless tobacco: Never  Substance Use Topics   Alcohol use: Yes    Comment: occ   Drug use: No    Home Medications Prior to Admission medications   Medication Sig Start Date End Date Taking? Authorizing Provider  oxyCODONE (ROXICODONE) 5 MG immediate release tablet Take 1 tablet (5 mg total) by mouth every 6 (six) hours as needed for up to 5 days for severe pain. 08/01/21 08/06/21 Yes Godfrey Pick, MD  aspirin EC 81 MG tablet Take 1 tablet (81 mg total) by mouth daily. Patient taking differently: Take 81 mg by mouth 2 (two) times daily.  07/01/16   Mikell, Jeani Sow, MD  aspirin-acetaminophen-caffeine (EXCEDRIN MIGRAINE) [phone] MG per tablet Take 1-2 tablets by mouth every 6 (six) hours as needed. migraines    [provider]  citalopram (CELEXA) 20 MG tablet Take 20 mg by mouth daily.    [provider]  diclofenac sodium (VOLTAREN) 1 % GEL Apply 2 g topically daily as needed for pain.    [provider]  docusate sodium (COLACE) 100 MG  capsule Take 1 capsule (100 mg total) by mouth 2 (two) times daily. Patient not taking: Reported on 04/15/2017 08/22/16   Donne Hazel, MD  ezetimibe (ZETIA) 10 MG tablet Take 10 mg by mouth at bedtime.     [provider]  fenofibrate (TRICOR) 48 MG tablet Take 48 mg by mouth at bedtime.     [provider]  fluticasone (FLONASE) 50 MCG/ACT nasal spray Place 1 spray into both nostrils daily as needed for allergies or rhinitis.    [provider]  gabapentin (NEURONTIN) 600 MG tablet Take 600 mg by mouth at bedtime.    [provider]  HYDROcodone-acetaminophen (NORCO/VICODIN) 5-325 MG tablet Take 2 tablets by mouth every 4 (four) hours as needed. Patient not taking: Reported on 04/15/2017 06/15/16   Noemi Chapel, MD  hydroxyurea (HYDREA) 500 MG capsule Take 2 capsules (1,000 mg total)  by mouth daily. May take with food to minimize GI side effects. Patient not taking: Reported on 04/15/2017 08/19/16   Penland, Kelby Fam, MD  Ibuprofen-Diphenhydramine HCl (ADVIL PM) 200-25 MG CAPS Take 1 tablet by mouth at bedtime as needed (sleep).    [provider]  iron polysaccharides (NIFEREX) 150 MG capsule Take 1 capsule (150 mg total) by mouth daily. Patient not taking: Reported on 04/15/2017 07/03/16   Penland, Kelby Fam, MD  ondansetron (ZOFRAN) 8 MG tablet Take 1 tablet (8 mg total) by mouth every 8 (eight) hours as needed for nausea or vomiting. Patient not taking: Reported on 08/21/2016 08/20/16   Penland, Kelby Fam, MD  polyethylene glycol (MIRALAX / GLYCOLAX) packet Take 17 g by mouth daily. Patient not taking: Reported on 04/15/2017 08/23/16   Donne Hazel, MD    Allergies    Morphine  Review of Systems   Review of Systems  Constitutional:  Negative for chills and fever.  HENT:  Negative for ear pain and sore throat.   Eyes:  Negative for pain and visual disturbance.  Respiratory:  Negative for cough, chest tightness and shortness of breath.   Cardiovascular:  Negative for chest pain and palpitations.  Gastrointestinal:  Negative for abdominal pain, diarrhea, nausea and vomiting.  Genitourinary:  Negative for dysuria, hematuria and pelvic pain.  Musculoskeletal:  Positive for arthralgias and joint swelling. Negative for back pain, neck pain and neck stiffness.  Skin:  Negative for color change, rash and wound.  Neurological:  Negative for dizziness, seizures, syncope, weakness, light-headedness, numbness and headaches.  Hematological:  Does not bruise/bleed easily.  Psychiatric/Behavioral:  Negative for confusion and decreased concentration. The patient is nervous/anxious.   All other systems reviewed and are negative.  Physical Exam Updated Vital Signs BP (!) 147/76   Pulse 70   Temp 98 F (36.7 C) (Oral)   Resp 18   Ht 5\' 6"  (1.676 m)   Wt 64 kg   SpO2  96%   BMI 22.76 kg/m   Physical Exam Vitals and nursing note reviewed.  Constitutional:      General: She is not in acute distress.    Appearance: Normal appearance. She is well-developed. She is not ill-appearing, toxic-appearing or diaphoretic.  HENT:     Head: Normocephalic and atraumatic.     Right Ear: External ear normal.     Left Ear: External ear normal.     Nose: Nose normal.  Eyes:     Extraocular Movements: Extraocular movements intact.     Conjunctiva/sclera: Conjunctivae normal.  Cardiovascular:     Rate and Rhythm:  Normal rate and regular rhythm.     Heart sounds: No murmur heard. Pulmonary:     Effort: Pulmonary effort is normal. No respiratory distress.     Breath sounds: Normal breath sounds.  Chest:     Chest wall: No tenderness.  Abdominal:     Palpations: Abdomen is soft.     Tenderness: There is no abdominal tenderness.  Musculoskeletal:        General: Swelling, tenderness and signs of injury present. No deformity.     Cervical back: Normal range of motion and neck supple. No rigidity.  Skin:    General: Skin is warm and dry.     Capillary Refill: Capillary refill takes less than 2 seconds.     Coloration: Skin is not jaundiced or pale.  Neurological:     General: No focal deficit present.     Mental Status: She is alert and oriented to person, place, and time.     Cranial Nerves: No cranial nerve deficit.     Sensory: No sensory deficit.     Motor: No weakness.  Psychiatric:        Mood and Affect: Mood is anxious.        Speech: Speech normal.        Behavior: Behavior normal. Behavior is cooperative.    ED Results / Procedures / Treatments   Labs (all labs ordered are listed, but only abnormal results are displayed) Labs Reviewed - No data to display  EKG None  Radiology DG Tibia/Fibula Right  Result Date: 08/01/2021 CLINICAL DATA:  Fall walking to get mail and rolled ankle in pothole Right ankle pain and swelling Right hip pain  Right foot pain Left ankle pain Left foot pain EXAM: DG HIP (WITH OR WITHOUT PELVIS) 2-3V RIGHT RIGHT TIBIA AND FIBULA - 2 VIEW RIGHT ANKLE - COMPLETE 3+ VIEW RIGHT FOOT COMPLETE - 3+ VIEW LEFT ANKLE COMPLETE - 3+ VIEW LEFT FOOT - COMPLETE 3+ VIEW COMPARISON:  None. FINDINGS: Left foot: 3 mm metallic density foreign body seen imbedded within the soft tissues of the base of the third digit. Mild irregularity noted along the lateral aspect of the base of the first metatarsal. This could be related to a Lisfranc fracture. Please correlate for focal tenderness. Left ankle: Nondisplaced fracture of the lateral malleolus with moderate overlying soft tissue swelling. Ankle joint effusion is present. Right hip: No fracture or dislocation. Minimal spurring and joint space loss of the right hip. Soft tissues are unremarkable. Right tibia and fibula: No additional fractures or dislocations. Right ankle: Nondisplaced fracture of the lateral malleolus with moderate overlying soft tissue swelling. Ankle joint effusion is seen. Right foot: No additional fracture or dislocation. IMPRESSION: 1. Bilateral lateral malleolus ankle fractures. 2. 3 mm metallic density foreign body seen imbedded within the soft tissues at the base of the left third digit. 3. Irregularity along the lateral aspect of the base of the left first metatarsal may be related to a Lisfranc injury. Please correlate for focal tenderness. Electronically Signed   By: Miachel Roux M.D.   On: 08/01/2021 13:27   DG Ankle Complete Left  Result Date: 08/01/2021 CLINICAL DATA:  Fall walking to get mail and rolled ankle in pothole Right ankle pain and swelling Right hip pain Right foot pain Left ankle pain Left foot pain EXAM: DG HIP (WITH OR WITHOUT PELVIS) 2-3V RIGHT RIGHT TIBIA AND FIBULA - 2 VIEW RIGHT ANKLE - COMPLETE 3+ VIEW RIGHT FOOT COMPLETE - 3+ VIEW  LEFT ANKLE COMPLETE - 3+ VIEW LEFT FOOT - COMPLETE 3+ VIEW COMPARISON:  None. FINDINGS: Left foot: 3 mm  metallic density foreign body seen imbedded within the soft tissues of the base of the third digit. Mild irregularity noted along the lateral aspect of the base of the first metatarsal. This could be related to a Lisfranc fracture. Please correlate for focal tenderness. Left ankle: Nondisplaced fracture of the lateral malleolus with moderate overlying soft tissue swelling. Ankle joint effusion is present. Right hip: No fracture or dislocation. Minimal spurring and joint space loss of the right hip. Soft tissues are unremarkable. Right tibia and fibula: No additional fractures or dislocations. Right ankle: Nondisplaced fracture of the lateral malleolus with moderate overlying soft tissue swelling. Ankle joint effusion is seen. Right foot: No additional fracture or dislocation. IMPRESSION: 1. Bilateral lateral malleolus ankle fractures. 2. 3 mm metallic density foreign body seen imbedded within the soft tissues at the base of the left third digit. 3. Irregularity along the lateral aspect of the base of the left first metatarsal may be related to a Lisfranc injury. Please correlate for focal tenderness. Electronically Signed   By: Miachel Roux M.D.   On: 08/01/2021 13:27   DG Ankle Complete Right  Result Date: 08/01/2021 CLINICAL DATA:  Fall walking to get mail and rolled ankle in pothole Right ankle pain and swelling Right hip pain Right foot pain Left ankle pain Left foot pain EXAM: DG HIP (WITH OR WITHOUT PELVIS) 2-3V RIGHT RIGHT TIBIA AND FIBULA - 2 VIEW RIGHT ANKLE - COMPLETE 3+ VIEW RIGHT FOOT COMPLETE - 3+ VIEW LEFT ANKLE COMPLETE - 3+ VIEW LEFT FOOT - COMPLETE 3+ VIEW COMPARISON:  None. FINDINGS: Left foot: 3 mm metallic density foreign body seen imbedded within the soft tissues of the base of the third digit. Mild irregularity noted along the lateral aspect of the base of the first metatarsal. This could be related to a Lisfranc fracture. Please correlate for focal tenderness. Left ankle: Nondisplaced  fracture of the lateral malleolus with moderate overlying soft tissue swelling. Ankle joint effusion is present. Right hip: No fracture or dislocation. Minimal spurring and joint space loss of the right hip. Soft tissues are unremarkable. Right tibia and fibula: No additional fractures or dislocations. Right ankle: Nondisplaced fracture of the lateral malleolus with moderate overlying soft tissue swelling. Ankle joint effusion is seen. Right foot: No additional fracture or dislocation. IMPRESSION: 1. Bilateral lateral malleolus ankle fractures. 2. 3 mm metallic density foreign body seen imbedded within the soft tissues at the base of the left third digit. 3. Irregularity along the lateral aspect of the base of the left first metatarsal may be related to a Lisfranc injury. Please correlate for focal tenderness. Electronically Signed   By: Miachel Roux M.D.   On: 08/01/2021 13:27   DG Foot Complete Left  Result Date: 08/01/2021 CLINICAL DATA:  Fall walking to get mail and rolled ankle in pothole Right ankle pain and swelling Right hip pain Right foot pain Left ankle pain Left foot pain EXAM: DG HIP (WITH OR WITHOUT PELVIS) 2-3V RIGHT RIGHT TIBIA AND FIBULA - 2 VIEW RIGHT ANKLE - COMPLETE 3+ VIEW RIGHT FOOT COMPLETE - 3+ VIEW LEFT ANKLE COMPLETE - 3+ VIEW LEFT FOOT - COMPLETE 3+ VIEW COMPARISON:  None. FINDINGS: Left foot: 3 mm metallic density foreign body seen imbedded within the soft tissues of the base of the third digit. Mild irregularity noted along the lateral aspect of the base of the first  metatarsal. This could be related to a Lisfranc fracture. Please correlate for focal tenderness. Left ankle: Nondisplaced fracture of the lateral malleolus with moderate overlying soft tissue swelling. Ankle joint effusion is present. Right hip: No fracture or dislocation. Minimal spurring and joint space loss of the right hip. Soft tissues are unremarkable. Right tibia and fibula: No additional fractures or  dislocations. Right ankle: Nondisplaced fracture of the lateral malleolus with moderate overlying soft tissue swelling. Ankle joint effusion is seen. Right foot: No additional fracture or dislocation. IMPRESSION: 1. Bilateral lateral malleolus ankle fractures. 2. 3 mm metallic density foreign body seen imbedded within the soft tissues at the base of the left third digit. 3. Irregularity along the lateral aspect of the base of the left first metatarsal may be related to a Lisfranc injury. Please correlate for focal tenderness. Electronically Signed   By: Miachel Roux M.D.   On: 08/01/2021 13:27   DG Foot Complete Right  Result Date: 08/01/2021 CLINICAL DATA:  Fall walking to get mail and rolled ankle in pothole Right ankle pain and swelling Right hip pain Right foot pain Left ankle pain Left foot pain EXAM: DG HIP (WITH OR WITHOUT PELVIS) 2-3V RIGHT RIGHT TIBIA AND FIBULA - 2 VIEW RIGHT ANKLE - COMPLETE 3+ VIEW RIGHT FOOT COMPLETE - 3+ VIEW LEFT ANKLE COMPLETE - 3+ VIEW LEFT FOOT - COMPLETE 3+ VIEW COMPARISON:  None. FINDINGS: Left foot: 3 mm metallic density foreign body seen imbedded within the soft tissues of the base of the third digit. Mild irregularity noted along the lateral aspect of the base of the first metatarsal. This could be related to a Lisfranc fracture. Please correlate for focal tenderness. Left ankle: Nondisplaced fracture of the lateral malleolus with moderate overlying soft tissue swelling. Ankle joint effusion is present. Right hip: No fracture or dislocation. Minimal spurring and joint space loss of the right hip. Soft tissues are unremarkable. Right tibia and fibula: No additional fractures or dislocations. Right ankle: Nondisplaced fracture of the lateral malleolus with moderate overlying soft tissue swelling. Ankle joint effusion is seen. Right foot: No additional fracture or dislocation. IMPRESSION: 1. Bilateral lateral malleolus ankle fractures. 2. 3 mm metallic density foreign body  seen imbedded within the soft tissues at the base of the left third digit. 3. Irregularity along the lateral aspect of the base of the left first metatarsal may be related to a Lisfranc injury. Please correlate for focal tenderness. Electronically Signed   By: Miachel Roux M.D.   On: 08/01/2021 13:27   DG Hip Unilat W or Wo Pelvis 2-3 Views Right  Result Date: 08/01/2021 CLINICAL DATA:  Fall walking to get mail and rolled ankle in pothole Right ankle pain and swelling Right hip pain Right foot pain Left ankle pain Left foot pain EXAM: DG HIP (WITH OR WITHOUT PELVIS) 2-3V RIGHT RIGHT TIBIA AND FIBULA - 2 VIEW RIGHT ANKLE - COMPLETE 3+ VIEW RIGHT FOOT COMPLETE - 3+ VIEW LEFT ANKLE COMPLETE - 3+ VIEW LEFT FOOT - COMPLETE 3+ VIEW COMPARISON:  None. FINDINGS: Left foot: 3 mm metallic density foreign body seen imbedded within the soft tissues of the base of the third digit. Mild irregularity noted along the lateral aspect of the base of the first metatarsal. This could be related to a Lisfranc fracture. Please correlate for focal tenderness. Left ankle: Nondisplaced fracture of the lateral malleolus with moderate overlying soft tissue swelling. Ankle joint effusion is present. Right hip: No fracture or dislocation. Minimal spurring and joint space  loss of the right hip. Soft tissues are unremarkable. Right tibia and fibula: No additional fractures or dislocations. Right ankle: Nondisplaced fracture of the lateral malleolus with moderate overlying soft tissue swelling. Ankle joint effusion is seen. Right foot: No additional fracture or dislocation. IMPRESSION: 1. Bilateral lateral malleolus ankle fractures. 2. 3 mm metallic density foreign body seen imbedded within the soft tissues at the base of the left third digit. 3. Irregularity along the lateral aspect of the base of the left first metatarsal may be related to a Lisfranc injury. Please correlate for focal tenderness. Electronically Signed   By: Miachel Roux M.D.    On: 08/01/2021 13:27    Procedures Procedures   Medications Ordered in ED Medications - No data to display  ED Course  I have reviewed the triage vital signs and the nursing notes.  Pertinent labs & imaging results that were available during my care of the patient were reviewed by me and considered in my medical decision making (see chart for details).    MDM Rules/Calculators/A&P                          Patient presents after ground-level fall during which she rolled both of her ankles.  Vital signs are normal upon arrival.  Patient has bruising and swelling to bilateral lateral malleoli.  She has tenderness throughout her ankles and feet that is maximal in the area of the bruising and swelling.  Prior to arrival, she did receive fentanyl.  As needed fentanyl was ordered for her stay in the ED.  X-ray imaging showed bilateral, nondisplaced lateral malleoli fractures.  I discussed this patient with orthopedic surgeon on-call, Dr. Amedeo Kinsman.  He advised bilateral cam boots and follow-up next week.  At this point, patient was more emotional.  Her daughter is at bedside.  Patient's daughter is making plans of how to accommodate her mother with her new injuries.  Patient states that she "does not want to be able burden to anyone".  Patient and daughter are worried about ambulation.  Patient was advised that she can weight-bear as tolerated.  She was instructed to take Tylenol and ibuprofen for pain control.  Additionally, narcotic pain medication was prescribed.  Wheelchair was also prescribed.  Patient was discharged in good condition. Final Clinical Impression(s) / ED Diagnoses Final diagnoses:  Closed nondisplaced fracture of lateral malleolus of left fibula, initial encounter  Closed nondisplaced fracture of lateral malleolus of right fibula, initial encounter    Rx / DC Orders ED Discharge Orders          Ordered    Wheelchair        08/01/21 1528    oxyCODONE (ROXICODONE) 5 MG  immediate release tablet  Every 6 hours PRN        08/01/21 1528             Godfrey Pick, MD 08/02/21 2338

## 2021-08-01 NOTE — ED Triage Notes (Signed)
Pt BIB [facility] after fall in yard from stepping in hole; pt c/o right hip pain and leg pain, bilateral foot and ankle pain, denies LOC or hitting head, denies thinners; pt given 4 Zofran and 75 Fentanyl en route

## 2021-08-01 NOTE — Discharge Instructions (Signed)
Call the number below for Dr. Amedeo Kinsman to set up an appointment for next week.  Take ibuprofen and Tylenol for baseline pain control.  Additionally, there is a prescription for narcotics that was sent to your pharmacy of choice.  Take these only as needed.  Elevate feet when possible.  Wear CAM boots when moving around.  Weight-bear as tolerated.

## 2021-08-01 NOTE — ED Notes (Signed)
EDP at bedside for [facility]SE.  

## 2021-12-16 ENCOUNTER — Other Ambulatory Visit: Payer: Self-pay | Admitting: Physical Medicine & Rehabilitation

## 2021-12-16 DIAGNOSIS — G8929 Other chronic pain: Secondary | ICD-10-CM

## 2021-12-16 DIAGNOSIS — M542 Cervicalgia: Secondary | ICD-10-CM

## 2022-01-26 ENCOUNTER — Other Ambulatory Visit: Payer: Medicare Other

## 2022-01-26 ENCOUNTER — Ambulatory Visit: Payer: Medicare Other

## 2022-01-27 ENCOUNTER — Other Ambulatory Visit: Payer: Medicare Other

## 2022-02-12 ENCOUNTER — Ambulatory Visit
Admission: RE | Admit: 2022-02-12 | Discharge: 2022-02-12 | Disposition: A | Discharge status: Home / Self Care | Payer: Medicare Other | Source: Ambulatory Visit | Attending: Physical Medicine & Rehabilitation | Admitting: Physical Medicine & Rehabilitation

## 2022-02-12 DIAGNOSIS — M5442 Lumbago with sciatica, left side: Secondary | ICD-10-CM | POA: Diagnosis not present

## 2022-02-12 DIAGNOSIS — G8929 Other chronic pain: Secondary | ICD-10-CM

## 2022-02-12 DIAGNOSIS — M5441 Lumbago with sciatica, right side: Secondary | ICD-10-CM | POA: Insufficient documentation

## 2022-02-12 DIAGNOSIS — M542 Cervicalgia: Secondary | ICD-10-CM | POA: Insufficient documentation

## 2022-04-07 ENCOUNTER — Emergency Department (HOSPITAL_COMMUNITY): Payer: Medicare Other

## 2022-04-07 ENCOUNTER — Encounter (HOSPITAL_COMMUNITY): Payer: Self-pay | Admitting: Emergency Medicine

## 2022-04-07 ENCOUNTER — Other Ambulatory Visit: Payer: Self-pay

## 2022-04-07 ENCOUNTER — Emergency Department (HOSPITAL_COMMUNITY)
Admission: EM | Admit: 2022-04-07 | Discharge: 2022-04-07 | Disposition: A | Discharge status: Home / Self Care | Payer: Medicare Other | Attending: Emergency Medicine | Admitting: Emergency Medicine

## 2022-04-07 DIAGNOSIS — R0602 Shortness of breath: Secondary | ICD-10-CM | POA: Diagnosis not present

## 2022-04-07 DIAGNOSIS — Z7982 Long term (current) use of aspirin: Secondary | ICD-10-CM | POA: Insufficient documentation

## 2022-04-07 DIAGNOSIS — R0789 Other chest pain: Secondary | ICD-10-CM | POA: Diagnosis not present

## 2022-04-07 DIAGNOSIS — R1013 Epigastric pain: Secondary | ICD-10-CM | POA: Insufficient documentation

## 2022-04-07 DIAGNOSIS — R091 Pleurisy: Secondary | ICD-10-CM | POA: Diagnosis present

## 2022-04-07 LAB — BASIC METABOLIC PANEL
Anion gap: 10 (ref 5–15)
BUN: 11 mg/dL (ref 8–23)
CO2: 26 mmol/L (ref 22–32)
Calcium: 9.1 mg/dL (ref 8.9–10.3)
Chloride: 103 mmol/L (ref 98–111)
Creatinine, Ser: 0.81 mg/dL (ref 0.44–1.00)
GFR, Estimated: >60 mL/min (ref >60)
Glucose, Bld: 114 mg/dL — ABNORMAL HIGH (ref 70–99)
Potassium: 3.7 mmol/L (ref 3.5–5.1)
Sodium: 139 mmol/L (ref 135–145)

## 2022-04-07 LAB — TROPONIN I (HIGH SENSITIVITY)
Troponin I (High Sensitivity): 3 ng/L (ref <18)
Troponin I (High Sensitivity): 3 ng/L (ref <18)

## 2022-04-07 LAB — CBC WITH DIFFERENTIAL/PLATELET
Abs Immature Granulocytes: 0.02 10*3/uL (ref 0.00–0.07)
Basophils Absolute: 0 10*3/uL (ref 0.0–0.1)
Basophils Relative: 0 %
Eosinophils Absolute: 0.2 10*3/uL (ref 0.0–0.5)
Eosinophils Relative: 3 %
HCT: 45.2 % (ref 36.0–46.0)
Hemoglobin: 14.5 g/dL (ref 12.0–15.0)
Immature Granulocytes: 0 %
Lymphocytes Relative: 24 %
Lymphs Abs: 1.9 10*3/uL (ref 0.7–4.0)
MCH: 29.8 pg (ref 26.0–34.0)
MCHC: 32.1 g/dL (ref 30.0–36.0)
MCV: 92.8 fL (ref 80.0–100.0)
Monocytes Absolute: 0.6 10*3/uL (ref 0.1–1.0)
Monocytes Relative: 8 %
Neutro Abs: 5.3 10*3/uL (ref 1.7–7.7)
Neutrophils Relative %: 65 %
Platelets: 703 10*3/uL — ABNORMAL HIGH (ref 150–400)
RBC: 4.87 MIL/uL (ref 3.87–5.11)
RDW: 13.6 % (ref 11.5–15.5)
WBC: 8.2 10*3/uL (ref 4.0–10.5)
nRBC: 0 % (ref 0.0–0.2)

## 2022-04-07 LAB — LIPASE, BLOOD: Lipase: 30 U/L (ref 11–51)

## 2022-04-07 MED ORDER — ALBUTEROL SULFATE HFA 108 (90 BASE) MCG/ACT IN AERS: 2.0000 | INHALATION_SPRAY | RESPIRATORY_TRACT | 3 refills | Status: AC | PRN

## 2022-04-07 MED ORDER — KETOROLAC TROMETHAMINE 30 MG/ML IJ SOLN
30.0000 mg | Freq: Once | INTRAMUSCULAR | Status: AC
  Administered 2022-04-07: 30 mg via INTRAVENOUS
  Filled 2022-04-07: qty 1

## 2022-04-07 MED ORDER — PREDNISONE 20 MG PO TABS: 40.0000 mg | ORAL_TABLET | Freq: Every day | ORAL | 0 refills | Status: AC

## 2022-04-07 MED ORDER — IOHEXOL 350 MG/ML SOLN
100.0000 mL | Freq: Once | INTRAVENOUS | Status: AC | PRN
  Administered 2022-04-07: 75 mL via INTRAVENOUS

## 2022-04-07 NOTE — ED Provider Triage Note (Signed)
Emergency Medicine Provider Triage Evaluation Note  Colleen Nash , a 71 y.o. female with past medical history of anxiety, thrombocytosis hyperlipidemia and anemia was evaluated in triage.  Pt complains of squeezing pain of her upper chest and pain of her upper back.  Times have been present for 5 to 6 weeks.  Gradually worsening.  This morning, she woke with worse pain across her chest.  She states it hurts to take a deep breath.  She also describes having orthopnea.  She states she has been evaluated by cardiologist, no history of heart disease.  Chest pain has been associated with a slight cough that is mostly been nonproductive.  Pain worsens with deep breathing.  She is a non-smoker.  No prior DVT or PE.  She denies any recent leg pain or swelling.  Review of Systems  Positive: Chest pain, upper back pain, shortness of breath and orthopnea Negative: Fever, chills, peripheral edema  Physical Exam  BP (!) 130/101 (BP Location: Right Arm)   Pulse 71   Temp 97.7 F (36.5 C) (Oral)   Resp 20   Ht '5\' 6"'$  (1.676 m)   Wt 64 kg   SpO2 96%   BMI 22.77 kg/m  Gen:   Awake, no distress appears uncomfortable Resp:  Normal effort , lungs clear to auscultation bilaterally MSK:   Moves extremities without difficulty  Other:  No edema of the bilateral lower extremities  Medical Decision Making  Medically screening exam initiated at 2:15 PM.  Appropriate orders placed.  Donald Prose was informed that the remainder of the evaluation will be completed by another provider, this initial triage assessment does not replace that evaluation, and the importance of remaining in the ED until their evaluation is complete.  Patient here with history of upper chest and back pain for several weeks.  Describes pain is worsening for the last several days.  She is now having orthopnea.  Nonproductive cough no fever or chills.  She will need further evaluation in the emergency department.  Patient agreeable to plan.    Kem Parkinson, PA-C 04/07/22 1430

## 2023-02-15 ENCOUNTER — Emergency Department (HOSPITAL_COMMUNITY)
Admission: EM | Admit: 2023-02-15 | Discharge: 2023-02-15 | Disposition: A | Discharge status: Home / Self Care | Payer: Medicare Other | Attending: Emergency Medicine | Admitting: Emergency Medicine

## 2023-02-15 ENCOUNTER — Other Ambulatory Visit: Payer: Self-pay

## 2023-02-15 ENCOUNTER — Emergency Department (HOSPITAL_COMMUNITY): Payer: Medicare Other

## 2023-02-15 ENCOUNTER — Encounter (HOSPITAL_COMMUNITY): Payer: Self-pay | Admitting: *Deleted

## 2023-02-15 DIAGNOSIS — R509 Fever, unspecified: Secondary | ICD-10-CM | POA: Diagnosis present

## 2023-02-15 DIAGNOSIS — U071 COVID-19: Secondary | ICD-10-CM | POA: Insufficient documentation

## 2023-02-15 LAB — URINALYSIS, ROUTINE W REFLEX MICROSCOPIC
Bilirubin Urine: NEGATIVE
Glucose, UA: NEGATIVE mg/dL
Hgb urine dipstick: NEGATIVE
Ketones, ur: NEGATIVE mg/dL
Nitrite: NEGATIVE
Protein, ur: NEGATIVE mg/dL
Specific Gravity, Urine: 1.008 (ref 1.005–1.030)
pH: 6 (ref 5.0–8.0)

## 2023-02-15 LAB — COMPREHENSIVE METABOLIC PANEL
ALT: 29 U/L (ref 0–44)
AST: 24 U/L (ref 15–41)
Albumin: 3.8 g/dL (ref 3.5–5.0)
Alkaline Phosphatase: 50 U/L (ref 38–126)
Anion gap: 9 (ref 5–15)
BUN: 12 mg/dL (ref 8–23)
CO2: 25 mmol/L (ref 22–32)
Calcium: 8.8 mg/dL — ABNORMAL LOW (ref 8.9–10.3)
Chloride: 102 mmol/L (ref 98–111)
Creatinine, Ser: 0.83 mg/dL (ref 0.44–1.00)
GFR, Estimated: >60 mL/min (ref >60)
Glucose, Bld: 115 mg/dL — ABNORMAL HIGH (ref 70–99)
Potassium: 3.9 mmol/L (ref 3.5–5.1)
Sodium: 136 mmol/L (ref 135–145)
Total Bilirubin: 0.8 mg/dL (ref 0.3–1.2)
Total Protein: 6.9 g/dL (ref 6.5–8.1)

## 2023-02-15 LAB — CBC WITH DIFFERENTIAL/PLATELET
Abs Immature Granulocytes: 0.01 10*3/uL (ref 0.00–0.07)
Basophils Absolute: 0 10*3/uL (ref 0.0–0.1)
Basophils Relative: 1 %
Eosinophils Absolute: 0 10*3/uL (ref 0.0–0.5)
Eosinophils Relative: 0 %
HCT: 47 % — ABNORMAL HIGH (ref 36.0–46.0)
Hemoglobin: 15.8 g/dL — ABNORMAL HIGH (ref 12.0–15.0)
Immature Granulocytes: 0 %
Lymphocytes Relative: 30 %
Lymphs Abs: 1.6 10*3/uL (ref 0.7–4.0)
MCH: 31.1 pg (ref 26.0–34.0)
MCHC: 33.6 g/dL (ref 30.0–36.0)
MCV: 92.5 fL (ref 80.0–100.0)
Monocytes Absolute: 0.7 10*3/uL (ref 0.1–1.0)
Monocytes Relative: 12 %
Neutro Abs: 3.1 10*3/uL (ref 1.7–7.7)
Neutrophils Relative %: 57 %
Platelets: 478 10*3/uL — ABNORMAL HIGH (ref 150–400)
RBC: 5.08 MIL/uL (ref 3.87–5.11)
RDW: 12.4 % (ref 11.5–15.5)
WBC: 5.4 10*3/uL (ref 4.0–10.5)
nRBC: 0 % (ref 0.0–0.2)

## 2023-02-15 LAB — GROUP A STREP BY PCR: Group A Strep by PCR: NOT DETECTED

## 2023-02-15 LAB — SARS CORONAVIRUS 2 BY RT PCR: SARS Coronavirus 2 by RT PCR: POSITIVE — AB

## 2023-02-15 MED ORDER — PAXLOVID (300/100) 20 X 150 MG & 10 X 100MG PO TBPK: 3.0000 | ORAL_TABLET | Freq: Two times a day (BID) | ORAL | 0 refills | Status: AC

## 2023-02-15 MED ORDER — LACTATED RINGERS IV BOLUS
500.0000 mL | Freq: Once | INTRAVENOUS | Status: AC
  Administered 2023-02-15: 500 mL via INTRAVENOUS

## 2023-02-15 MED ORDER — ONDANSETRON HCL 4 MG PO TABS: 4.0000 mg | ORAL_TABLET | Freq: Four times a day (QID) | ORAL | 0 refills | Status: AC

## 2023-02-15 MED ORDER — LACTATED RINGERS IV BOLUS
1000.0000 mL | Freq: Once | INTRAVENOUS | Status: AC
  Administered 2023-02-15: 1000 mL via INTRAVENOUS

## 2023-02-15 MED ORDER — KETOROLAC TROMETHAMINE 15 MG/ML IJ SOLN
15.0000 mg | Freq: Once | INTRAMUSCULAR | Status: AC
  Administered 2023-02-15: 15 mg via INTRAVENOUS
  Filled 2023-02-15: qty 1

## 2023-02-15 MED ORDER — ONDANSETRON HCL 4 MG/2ML IJ SOLN
4.0000 mg | Freq: Once | INTRAMUSCULAR | Status: AC
  Administered 2023-02-15: 4 mg via INTRAVENOUS
  Filled 2023-02-15: qty 2

## 2023-02-15 MED ORDER — KETOROLAC TROMETHAMINE 10 MG PO TABS: 10.0000 mg | ORAL_TABLET | Freq: Three times a day (TID) | ORAL | 0 refills | Status: AC | PRN

## 2023-02-15 NOTE — ED Provider Notes (Signed)
[place] EMERGENCY DEPARTMENT AT [facility] Provider Note   CSN: [id number] Arrival date & time: 02/15/23  4403     History  Chief Complaint  Patient presents with   Emesis    Colleen Nash is a 72 y.o. female.  Has PMH of high cholesterol, thrombocytosis, lumbar radiculopathy.  Presents the ER today for 3 days of nausea, subjective fever, body aches, or throat with odynophagia, nausea and vomiting last night.  She took an antiemetic last night, unsure what it was but this may have been prescribed previously.   The pain with swallowing "feels like needles" in he throat with swallowing  Patient is here with her daughter who she lives with.  Daughter states that her and her husband had COVID-19 2 weeks ago.  They state their symptoms are very similar to the mothers.  Patient states she has been having following that did not get better with cough drops.  She states she is having significant cough as well but cannot get anything to come up.  She denies any difficulty breathing.  She states her chest hurts when she coughs but denies any chest pain other than soreness with coughing.   Emesis      Home Medications Prior to Admission medications   Medication Sig Start Date End Date Taking? Authorizing Provider  ketorolac (TORADOL) 10 MG tablet Take 1 tablet (10 mg total) by mouth every 8 (eight) hours as needed for moderate pain. 02/15/23  Yes Syd Manges A, PA-C  nirmatrelvir & ritonavir (PAXLOVID, 300/100,) 20 x 150 MG & 10 x 100MG  TBPK Take 3 tablets by mouth 2 (two) times daily for 5 days. 02/15/23 02/20/23 Yes Angelin Cutrone A, PA-C  ondansetron (ZOFRAN) 4 MG tablet Take 1 tablet (4 mg total) by mouth every 6 (six) hours. 02/15/23  Yes Dierra Riesgo A, PA-C  albuterol (VENTOLIN HFA) 108 (90 Base) MCG/ACT inhaler Inhale 2 puffs into the lungs every 4 (four) hours as needed for wheezing or shortness of breath. 04/07/22   Eber Hong, MD  aspirin-acetaminophen-caffeine  (EXCEDRIN MIGRAINE) [phone] MG per tablet Take 1-2 tablets by mouth every 6 (six) hours as needed. migraines    [provider]  diclofenac sodium (VOLTAREN) 1 % GEL Apply 2 g topically daily as needed for pain.    [provider]  docusate sodium (COLACE) 100 MG capsule Take 1 capsule (100 mg total) by mouth 2 (two) times daily. 08/22/16   Jerald Kief, MD  ezetimibe (ZETIA) 10 MG tablet Take 10 mg by mouth at bedtime.     [provider]  fenofibrate (TRICOR) 48 MG tablet Take 48 mg by mouth at bedtime.     [provider]  fluticasone (FLONASE) 50 MCG/ACT nasal spray Place 1 spray into both nostrils daily as needed for allergies or rhinitis.    [provider]  Ibuprofen-Diphenhydramine HCl (ADVIL PM) 200-25 MG CAPS Take 1 tablet by mouth at bedtime as needed (sleep).    [provider]  omeprazole (PRILOSEC) 40 MG capsule Take 1 tablet by mouth daily. 06/25/20   [provider]  ondansetron (ZOFRAN) 8 MG tablet Take 1 tablet (8 mg total) by mouth every 8 (eight) hours as needed for nausea or vomiting. 08/20/16   Penland, Novella Olive, MD  predniSONE (DELTASONE) 20 MG tablet Take 2 tablets (40 mg total) by mouth daily. 04/07/22   Eber Hong, MD  pregabalin (LYRICA) 50 MG capsule Take 150 mg by mouth 2 (two) times  daily. 09/25/21   [provider]  traZODone (DESYREL) 100 MG tablet Take 100 mg by mouth at bedtime. 06/25/20   [provider]      Allergies    Morphine    Review of Systems   Review of Systems  Gastrointestinal:  Positive for vomiting.    Physical Exam Updated Vital Signs BP 136/72   Pulse 66   Temp 98.6 F (37 C)   Resp (!) 21   Ht 5\' 7"  (1.702 m)   Wt 65.7 kg   SpO2 93%   BMI 22.69 kg/m  Physical Exam Vitals and nursing note reviewed.  Constitutional:      General: She is not in acute distress.    Appearance: She is well-developed.  HENT:     Head: Normocephalic and atraumatic.      Mouth/Throat:     Mouth: Mucous membranes are moist.     Pharynx: Uvula midline. Posterior oropharyngeal erythema present. No pharyngeal swelling, oropharyngeal exudate or uvula swelling.     Tonsils: No tonsillar exudate or tonsillar abscesses.  Eyes:     Conjunctiva/sclera: Conjunctivae normal.  Cardiovascular:     Rate and Rhythm: Normal rate and regular rhythm.     Heart sounds: No murmur heard. Pulmonary:     Effort: Pulmonary effort is normal. No respiratory distress.     Breath sounds: Normal breath sounds.     Comments: Satting 97% on room air with good Pleth  Abdominal:     Palpations: Abdomen is soft.     Tenderness: There is no abdominal tenderness.  Musculoskeletal:        General: No swelling.     Cervical back: Neck supple.  Skin:    General: Skin is warm and dry.     Capillary Refill: Capillary refill takes less than 2 seconds.  Neurological:     Mental Status: She is alert.  Psychiatric:        Mood and Affect: Mood normal.     ED Results / Procedures / Treatments   Labs (all labs ordered are listed, but only abnormal results are displayed) Labs Reviewed  SARS CORONAVIRUS 2 BY RT PCR - Abnormal; Notable for the following components:      Result Value   SARS Coronavirus 2 by RT PCR POSITIVE (*)    All other components within normal limits  CBC WITH DIFFERENTIAL/PLATELET - Abnormal; Notable for the following components:   Hemoglobin 15.8 (*)    HCT 47.0 (*)    Platelets 478 (*)    All other components within normal limits  COMPREHENSIVE METABOLIC PANEL - Abnormal; Notable for the following components:   Glucose, Bld 115 (*)    Calcium 8.8 (*)    All other components within normal limits  URINALYSIS, ROUTINE W REFLEX MICROSCOPIC - Abnormal; Notable for the following components:   Leukocytes,Ua LARGE (*)    Bacteria, UA RARE (*)    All other components within normal limits  GROUP A STREP BY PCR    EKG None  Radiology DG Chest Portable 1  View  Addendum Date: 02/15/2023   ADDENDUM REPORT: 02/15/2023 10:54 ADDENDUM: Known chronic left-sided rib fracture deformities. Electronically Signed   By: Jackey Loge D.O.   On: 02/15/2023 10:54   Result Date: 02/15/2023 CLINICAL DATA:  Provided history: Cough. Additional history provided: Nausea, vomiting, sore throat, dysphagia. Sick contacts. EXAM: PORTABLE CHEST 1 VIEW COMPARISON:  CT angiogram chest 04/07/2022. Prior chest radiographs 08/21/2016 and earlier. FINDINGS: Heart size  within normal limits. Aortic atherosclerosis. Blunting of the left lateral costophrenic angle, which may reflect the presence of a small pleural effusion. No appreciable airspace consolidation or pulmonary edema. No evidence of pneumothorax. No acute osseous abnormality identified. IMPRESSION: 1. Blunting of the left lateral costophrenic angle, which may reflect the presence of a small pleural effusion. 2. No appreciable airspace consolidation. 3.  Aortic Atherosclerosis (ICD10-I70.0). Electronically Signed: By: Jackey Loge D.O. On: 02/15/2023 10:44    Procedures Procedures    Medications Ordered in ED Medications  ondansetron (ZOFRAN) injection 4 mg (4 mg Intravenous Given 02/15/23 1053)  lactated ringers bolus 1,000 mL (0 mLs Intravenous Stopped 02/15/23 1157)  ketorolac (TORADOL) 15 MG/ML injection 15 mg (15 mg Intravenous Given 02/15/23 1053)  ondansetron (ZOFRAN) injection 4 mg (4 mg Intravenous Given 02/15/23 1348)  lactated ringers bolus 500 mL (0 mLs Intravenous Stopped 02/15/23 1438)    ED Course/ Medical Decision Making/ A&P                             Medical Decision Making This patient presents to the ED for concern of cough, sore throat, nausea and vomiting, this involves an extensive number of treatment options, and is a complaint that carries with it a high risk of complications and morbidity.  The differential diagnosis includes influenza, pneumonia, sepsis, UTI, COVID-19, gastroenteritis, cholecystitis,  other   Co morbidities that complicate the patient evaluation  Thrombocytosi   Additional history obtained:  Additional history obtained from EMR External records from outside source obtained and reviewed including Prior labs and outpatient notes   Lab Tests:  I Ordered, and personally interpreted labs.  The pertinent results include: CBC shows increased globin likely representing hemoconcentration, platelets 478 which is somewhat lower than her baseline,   UA has large leukocytes and 11-20 white blood cells but patient has no dysuria, no frequency, no urgency.  No suprapubic tenderness, states she has had UTIs before and this feels nothing like that. Do not feel this  represents UTI  CMP is reassuring, COVID test was positive, strep is negative   Imaging Studies ordered:  I ordered imaging studies including chest x-ray I independently visualized and interpreted imaging which showed blunting left costophrenic angle likely very small pleural effusion I agree with the radiologist interpretation     Problem List / ED Course / Critical interventions / Medication management  Patient comes in with cough, sore throat with painful swallowing and nausea and vomiting.  This is in the setting of exposure to COVID-19 in her home.  She is positive for COVID, she is feeling much better after fluids and Zofran, she is tolerating p.o. ginger ale. I ambulated patient with pulse oximeter, she maintained 95% while ambulating, did not get short of breath, ambulated with steady gait. She was given Toradol for sore throat and states this helped immensely, asking for prescription for couple of Toradol for home.  Her positive COVID-19 and risk factor of age over 41 we will prescribe her Paxlovid.  Discussed with the pharmacist and they reviewed her medications, advised either stopping or cutting her trazodone in half.  Is on symptom control at home, isolation, follow-up and strict return precautions.   Patient and daughter agreeable with discharge home.  She does live with her daughter and son-in-law so will not be home alone. I ordered medication including toradol  for pain, zofran, nausea  Reevaluation of the patient after these medicines showed that  the patient improved I have reviewed the patients home medicines and have made adjustments as needed     Amount and/or Complexity of Data Reviewed Labs: ordered. Radiology: ordered.  Risk Prescription drug management.           Final Clinical Impression(s) / ED Diagnoses Final diagnoses:  COVID-19    Rx / DC Orders ED Discharge Orders          Ordered    ketorolac (TORADOL) 10 MG tablet  Every 8 hours PRN        02/15/23 1401    nirmatrelvir & ritonavir (PAXLOVID, 300/100,) 20 x 150 MG & 10 x 100MG  TBPK  2 times daily        02/15/23 1403    ondansetron (ZOFRAN) 4 MG tablet  Every 6 hours        02/15/23 1404              Ma Rings, PA-C 02/15/23 1457    Bethann Berkshire, MD 02/17/23 1557

## 2023-02-15 NOTE — Discharge Instructions (Signed)
You have COVID-19, make sure you isolate yourself away from a others for 10 days from the day the symptoms started.  Since your symptoms of started on with 3 days ago we will give you the antiviral Paxlovid.  This can help decrease the risk of severe disease along with hospitalization or death.  You need to either not take the trazodone or cut the dose in half while taking this medication.  Toradol for your sore throat and pain.  Do not take this with ibuprofen, Aleve or aspirin as they can interact.  Also take Tylenol if needed. Drink plenty of fluids and rest.  You are given Zofran to help with your nausea.  Follow-up closely with your primary care doctor.  If you have shortness of breath or any other new or worsening symptoms come back.

## 2023-02-15 NOTE — ED Triage Notes (Signed)
Pt c/o n/v, sore throat and dysphagia for the last 24 hours  Pt's family members had covid x 2 weeks
